# Patient Record
Sex: Female | Born: 1990 | Race: Black or African American | Marital: Single | Smoking: Current every day smoker
Health system: Southern US, Community
[De-identification: ages and names within clinical notes are randomized; demographics above are authoritative.]

## PROBLEM LIST (undated history)

## (undated) ENCOUNTER — Inpatient Hospital Stay (HOSPITAL_COMMUNITY): Payer: Self-pay

## (undated) HISTORY — PX: NO PAST SURGERIES: SHX2092

---
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
## 2014-02-04 ENCOUNTER — Encounter (HOSPITAL_COMMUNITY): Payer: Self-pay | Admitting: *Deleted

## 2015-04-06 NOTE — L&D Delivery Note (Signed)
Delivery Note At 6:10 PM a viable female was delivered via Vaginal, Spontaneous Delivery (Presentation:LOA ).  APGAR:9,9 ; weight pending .   Placenta status:intact ,delivered via Tomasa BlaseSchultz .  Cord: three vessels with the following complications:none .  Cord pH: N/A  Anesthesia:  Epidural Episiotomy: None Lacerations: 2nd degree Suture Repair:  3.0 vicryl Est. Blood Loss (mL):  50  Mom to postpartum.  Baby to Couplet care / Skin to Skin.  Clayton BiblesSamantha Weinhold, SNM 02/29/2016, 6:23 PM

## 2015-10-14 ENCOUNTER — Encounter: Payer: Self-pay | Admitting: Obstetrics and Gynecology

## 2015-10-14 ENCOUNTER — Ambulatory Visit (INDEPENDENT_AMBULATORY_CARE_PROVIDER_SITE_OTHER): Payer: Medicaid Other | Admitting: Obstetrics and Gynecology

## 2015-10-14 VITALS — BP 118/78 | HR 87 | Wt 198.0 lb

## 2015-10-14 DIAGNOSIS — Z3492 Encounter for supervision of normal pregnancy, unspecified, second trimester: Secondary | ICD-10-CM

## 2015-10-14 DIAGNOSIS — O09212 Supervision of pregnancy with history of pre-term labor, second trimester: Secondary | ICD-10-CM

## 2015-10-14 DIAGNOSIS — Z8751 Personal history of pre-term labor: Secondary | ICD-10-CM | POA: Insufficient documentation

## 2015-10-14 DIAGNOSIS — O0932 Supervision of pregnancy with insufficient antenatal care, second trimester: Secondary | ICD-10-CM | POA: Diagnosis not present

## 2015-10-14 DIAGNOSIS — O09219 Supervision of pregnancy with history of pre-term labor, unspecified trimester: Secondary | ICD-10-CM

## 2015-10-14 DIAGNOSIS — Z6830 Body mass index (BMI) 30.0-30.9, adult: Secondary | ICD-10-CM

## 2015-10-14 DIAGNOSIS — Z3482 Encounter for supervision of other normal pregnancy, second trimester: Secondary | ICD-10-CM | POA: Diagnosis not present

## 2015-10-14 DIAGNOSIS — O09892 Supervision of other high risk pregnancies, second trimester: Secondary | ICD-10-CM

## 2015-10-14 DIAGNOSIS — Z3490 Encounter for supervision of normal pregnancy, unspecified, unspecified trimester: Secondary | ICD-10-CM | POA: Insufficient documentation

## 2015-10-14 LAB — POCT URINALYSIS DIPSTICK
Bilirubin, UA: NEGATIVE
Blood, UA: 50
Glucose, UA: NEGATIVE
Nitrite, UA: NEGATIVE
SPEC GRAV UA: 1.015
UROBILINOGEN UA: NEGATIVE
pH, UA: 6

## 2015-10-14 NOTE — Progress Notes (Signed)
New OB Note  10/14/2015   Clinic: [facility]  Chief Complaint: New OB  Transfer of Care Patient: no  History of Present Illness: Ms. Wernette is a 25 y.o. G2P0101 @ 21/0 weeks ([place] 2/14, based on Patient's last menstrual period was 05/20/2015 (exact date).), with the above CC. Preg complicated by has Supervision of normal pregnancy in second trimester; History of preterm delivery, currently pregnant; and Late prenatal care affecting pregnancy in second trimester, antepartum on her problem list.  Her periods were: regular, qmonth She was using no method when she conceived.  She has Negative signs or symptoms of nausea/vomiting of pregnancy. She has Negative signs or symptoms of miscarriage or preterm labor She identifies Negative Zika risk factors for her and her partner  ROS: A 12-point review of systems was performed and negative, except as stated in the above HPI.  OBGYN History: As per HPI. OB History  Gravida Para Term Preterm AB SAB TAB Ectopic Multiple Living  _0    # Outcome Date GA Lbr Len/2nd Weight Sex Delivery Anes PTL Lv  2 Current           1 Preterm 07/08/11 23w3d36:12 / 00:44 5 lb 12.4 oz (2.62 kg) F Vag-Spont EPI  Y     Comments: WNL      Any prior children are healthy, doing well, without any problems or issues: yes History of abnormal pap smears: No. Last pap smear patient unsure History of STIs: No   Past Medical History: Past Medical History  Diagnosis Date  . Asthma     Past Surgical History: Past Surgical History  Procedure Laterality Date  . No past surgeries      Family History:  She denies any female cancers, bleeding or blood clotting disorders.  She denies any history of mental retardation, birth defects or genetic disorders in her or the FOB's history  Social History:  Social History   Social History  . Marital Status: Single    Spouse Name: N/A  . Number of Children: N/A  . Years of Education: N/A   Occupational  History  . Not on file.   Social History Main Topics  . Smoking status: Former SResearch scientist (life sciences) . Smokeless tobacco: Never Used  . Alcohol Use: No     Comment: Occas. when not preg.  . Drug Use: No  . Sexual Activity: Yes    Birth Control/ Protection: None     Comment: 1 week ago   Other Topics Concern  . Not on file   Social History Narrative    Allergy: No Known Allergies  Health Maintenance:  Mammogram Up to Date: not applicable  Current Outpatient Medications: PNV  Physical Exam:   BP 118/78 mmHg  Pulse 87  Wt 198 lb (89.812 kg)  LMP 05/20/2015 (Exact Date) Body mass index is 31.48 kg/(m^2). Fundal height: 20 FHTs: 140s on u/s  General appearance: Well nourished, well developed female in no acute distress.  Neck:  Supple, normal appearance, and no thyromegaly  Cardiovascular: S1, S2 normal, no murmur, rub or gallop, regular rate and rhythm Respiratory:  Clear to auscultation bilateral. Normal respiratory effort Abdomen: positive bowel sounds and no masses, hernias; diffusely non tender to palpation, non distended Breasts: breasts appear normal, no suspicious masses, no skin or nipple changes or axillary nodes, and normal inspection. Neuro/Psych:  Normal mood and affect.  Skin:  Warm and dry.  Lymphatic:  No inguinal lymphadenopathy.  Pelvic exam: is not limited by body habitus EGBUS: within normal limits, Vagina: within normal limits and with no blood in the vault, Cervix: normal appearing cervix without discharge or lesions, closed/long/high, Uterus:  enlarged, c/w 20 week size, and Adnexa:  normal adnexa and no mass, fullness, tenderness  Laboratory: none  Imaging:  Bedside shows SLIUP c/w 20/6 based on BPD, HC and FL. Subjectively normal AFI, normal FHR and good FM.  Assessment: pt doing well  Plan: 1. Supervision of normal pregnancy in second trimester Routine care. Counseled her to stop smoking as risks d/w her.  - Prenatal Profile I - HIV antibody  (with reflex) - Urine culture - Hemoglobinopathy evaluation - Cystic fibrosis gene test - AFP, Quad Screen - [place] MFM OB COMP + 14 WK; Future - Pap IG, CT/NG w/ reflex HPV when ASC-U - POCT urinalysis dipstick  2. History of preterm delivery, currently pregnant, second trimester 36/3 wk PPROM in 07/2011. Once u/s is officially dates and dates officially established, can d/w pt but likely too late for 17p  3. Late prenatal care affecting pregnancy in second trimester, antepartum UDS today - [id number] 11+Oxyco+Alc+Crt-Bund  4. BMI 30.0-30.9,adult Early 1hr today and baseline pre-x labs - Glucose Tolerance, 1 Hour - Comp Met (CMET) - Protein / Creatinine Ratio, Urine   Problem list reviewed and updated.  Follow up in 3 weeks.  >50% of 25 min visit spent on counseling and coordination of care.   Durene Romans MD Attending Center for [facility])

## 2015-10-15 ENCOUNTER — Encounter: Payer: Self-pay | Admitting: Obstetrics and Gynecology

## 2015-10-15 DIAGNOSIS — O99019 Anemia complicating pregnancy, unspecified trimester: Secondary | ICD-10-CM | POA: Insufficient documentation

## 2015-10-16 ENCOUNTER — Encounter: Payer: Self-pay | Admitting: Obstetrics and Gynecology

## 2015-10-16 DIAGNOSIS — Z6831 Body mass index (BMI) 31.0-31.9, adult: Secondary | ICD-10-CM | POA: Insufficient documentation

## 2015-10-16 LAB — PAP IG, CT-NG, RFX HPV ASCU
Chlamydia, Nuc. Acid Amp: NEGATIVE
Gonococcus by Nucleic Acid Amp: NEGATIVE
PAP SMEAR COMMENT: 0

## 2015-10-16 LAB — URINE CULTURE

## 2015-10-21 ENCOUNTER — Ambulatory Visit (HOSPITAL_COMMUNITY)
Admission: RE | Admit: 2015-10-21 | Discharge: 2015-10-21 | Disposition: A | Discharge status: Home / Self Care | Payer: Medicaid Other | Source: Ambulatory Visit | Attending: Obstetrics and Gynecology | Admitting: Obstetrics and Gynecology

## 2015-10-21 ENCOUNTER — Other Ambulatory Visit: Payer: Self-pay | Admitting: Obstetrics and Gynecology

## 2015-10-21 DIAGNOSIS — O09892 Supervision of other high risk pregnancies, second trimester: Secondary | ICD-10-CM

## 2015-10-21 DIAGNOSIS — O0932 Supervision of pregnancy with insufficient antenatal care, second trimester: Secondary | ICD-10-CM | POA: Diagnosis present

## 2015-10-21 DIAGNOSIS — O99512 Diseases of the respiratory system complicating pregnancy, second trimester: Secondary | ICD-10-CM | POA: Diagnosis not present

## 2015-10-21 DIAGNOSIS — J45909 Unspecified asthma, uncomplicated: Secondary | ICD-10-CM | POA: Diagnosis not present

## 2015-10-21 DIAGNOSIS — Z3492 Encounter for supervision of normal pregnancy, unspecified, second trimester: Secondary | ICD-10-CM

## 2015-10-21 DIAGNOSIS — Z3A22 22 weeks gestation of pregnancy: Secondary | ICD-10-CM | POA: Insufficient documentation

## 2015-10-21 DIAGNOSIS — O09212 Supervision of pregnancy with history of pre-term labor, second trimester: Secondary | ICD-10-CM | POA: Diagnosis not present

## 2015-10-21 LAB — PRENATAL PROFILE I(LABCORP)
ANTIBODY SCREEN: NEGATIVE
BASOS: 0 %
Basophils Absolute: 0 10*3/uL (ref 0.0–0.2)
EOS (ABSOLUTE): 0 10*3/uL (ref 0.0–0.4)
Eos: 0 %
HEP B S AG: NEGATIVE
Hematocrit: 31.6 % — ABNORMAL LOW (ref 34.0–46.6)
Hemoglobin: 10 g/dL — ABNORMAL LOW (ref 11.1–15.9)
IMMATURE GRANS (ABS): 0 10*3/uL (ref 0.0–0.1)
IMMATURE GRANULOCYTES: 0 %
LYMPHS ABS: 1.5 10*3/uL (ref 0.7–3.1)
Lymphs: 13 %
MCH: 26.8 pg (ref 26.6–33.0)
MCHC: 31.6 g/dL (ref 31.5–35.7)
MCV: 85 fL (ref 79–97)
MONOS ABS: 0.4 10*3/uL (ref 0.1–0.9)
Monocytes: 4 %
NEUTROS PCT: 83 %
Neutrophils Absolute: 9.5 10*3/uL — ABNORMAL HIGH (ref 1.4–7.0)
Platelets: 244 10*3/uL (ref 150–379)
RBC: 3.73 x10E6/uL — ABNORMAL LOW (ref 3.77–5.28)
RDW: 17.4 % — ABNORMAL HIGH (ref 12.3–15.4)
RPR: NONREACTIVE
RUBELLA: 4.13 {index} (ref >0.99)
Rh Factor: POSITIVE
WBC: 11.4 10*3/uL — ABNORMAL HIGH (ref 3.4–10.8)

## 2015-10-21 LAB — GLUCOSE TOLERANCE, 1 HOUR: Glucose, 1Hr PP: 107 mg/dL (ref 65–199)

## 2015-10-21 LAB — AFP, QUAD SCREEN
DIA Mom Value: 2.68
DIA Value (EIA): 486.92 pg/mL
DSR (By Age)    1 IN: 1078
DSR (SECOND TRIMESTER) 1 IN: 2524
Gestational Age: 21 WEEKS
MSAFP Mom: 0.92
MSAFP: 54.7 ng/mL
MSHCG Mom: 0.59
MSHCG: 10823 m[IU]/mL
Maternal Age At EDD: 23.8 YEARS
Osb Risk: 10000
PDF: 0
TEST RESULTS AFP: NEGATIVE
WEIGHT: 198 [lb_av]
uE3 Mom: 0.69
uE3 Value: 1.37 ng/mL

## 2015-10-21 LAB — COMPREHENSIVE METABOLIC PANEL
A/G RATIO: 1.4 (ref 1.2–2.2)
ALBUMIN: 3.7 g/dL (ref 3.5–5.5)
ALT: 7 IU/L (ref 0–32)
AST: 12 IU/L (ref 0–40)
Alkaline Phosphatase: 48 IU/L (ref 39–117)
BUN / CREAT RATIO: 13 (ref 9–23)
BUN: 7 mg/dL (ref 6–20)
Bilirubin Total: <0.2 mg/dL (ref 0.0–1.2)
CALCIUM: 8.9 mg/dL (ref 8.7–10.2)
CO2: 18 mmol/L (ref 18–29)
Chloride: 99 mmol/L (ref 96–106)
Creatinine, Ser: 0.53 mg/dL — ABNORMAL LOW (ref 0.57–1.00)
GFR calc Af Amer: 155 mL/min/{1.73_m2} (ref >59)
GFR, EST NON AFRICAN AMERICAN: 134 mL/min/{1.73_m2} (ref >59)
GLOBULIN, TOTAL: 2.6 g/dL (ref 1.5–4.5)
Glucose: 111 mg/dL — ABNORMAL HIGH (ref 65–99)
POTASSIUM: 3.6 mmol/L (ref 3.5–5.2)
SODIUM: 136 mmol/L (ref 134–144)
Total Protein: 6.3 g/dL (ref 6.0–8.5)

## 2015-10-21 LAB — HIV ANTIBODY (ROUTINE TESTING W REFLEX): HIV SCREEN 4TH GENERATION: NONREACTIVE

## 2015-10-21 LAB — HEMOGLOBINOPATHY EVALUATION
HGB A: 97.7 % (ref 94.0–98.0)
HGB C: 0 %
HGB S: 0 %
Hemoglobin A2 Quantitation: 2.3 % (ref 0.7–3.1)
Hemoglobin F Quantitation: 0 % (ref 0.0–2.0)

## 2015-10-21 LAB — PROTEIN / CREATININE RATIO, URINE
Creatinine, Urine: 228 mg/dL
PROTEIN UR: 16.8 mg/dL
Protein/Creat Ratio: 74 mg/g creat (ref 0–200)

## 2015-10-21 LAB — CYSTIC FIBROSIS GENE TEST

## 2015-11-04 ENCOUNTER — Encounter: Payer: Medicaid Other | Admitting: Family Medicine

## 2015-11-10 ENCOUNTER — Ambulatory Visit (INDEPENDENT_AMBULATORY_CARE_PROVIDER_SITE_OTHER): Payer: Medicaid Other | Admitting: Obstetrics and Gynecology

## 2015-11-10 VITALS — BP 98/69 | HR 88 | Wt 203.0 lb

## 2015-11-10 DIAGNOSIS — Z3482 Encounter for supervision of other normal pregnancy, second trimester: Secondary | ICD-10-CM

## 2015-11-10 DIAGNOSIS — Z6831 Body mass index (BMI) 31.0-31.9, adult: Secondary | ICD-10-CM

## 2015-11-10 DIAGNOSIS — O09212 Supervision of pregnancy with history of pre-term labor, second trimester: Secondary | ICD-10-CM

## 2015-11-10 DIAGNOSIS — O09892 Supervision of other high risk pregnancies, second trimester: Secondary | ICD-10-CM

## 2015-11-10 DIAGNOSIS — O0932 Supervision of pregnancy with insufficient antenatal care, second trimester: Secondary | ICD-10-CM

## 2015-11-10 DIAGNOSIS — Z3492 Encounter for supervision of normal pregnancy, unspecified, second trimester: Secondary | ICD-10-CM

## 2015-11-10 MED ORDER — DOCUSATE SODIUM 100 MG PO CAPS: 100.0000 mg | ORAL_CAPSULE | Freq: Two times a day (BID) | ORAL | 2 refills | Status: DC | PRN

## 2015-11-10 MED ORDER — PRENATAL MULTIVITAMIN CH: 1.0000 | ORAL_TABLET | Freq: Every day | ORAL | 6 refills | Status: DC

## 2015-11-10 MED ORDER — FERROUS SULFATE 325 (65 FE) MG PO TABS
325.0000 mg | ORAL_TABLET | Freq: Two times a day (BID) | ORAL | 1 refills | Status: DC
Start: 2015-11-10 — End: 2017-07-30

## 2015-11-10 NOTE — Progress Notes (Signed)
Subjective:  Hannah Stone is a 25 y.o. G2P0101 at 2313w6d being seen today for ongoing prenatal care.  She is currently monitored for the following issues for this high-risk pregnancy and has Supervision of normal pregnancy in second trimester; History of preterm delivery, currently pregnant; Late prenatal care affecting pregnancy in second trimester, antepartum; Anemia in pregnancy; and BMI 31.0-31.9,adult on her problem list.  Patient reports no complaints.  Contractions: Irregular. Vag. Bleeding: None.  Movement: Present. Denies leaking of fluid.   The following portions of the patient's history were reviewed and updated as appropriate: allergies, current medications, past family history, past medical history, past social history, past surgical history and problem list. Problem list updated.  Objective:   Vitals:   11/10/15 0949  BP: 98/69  Pulse: 88  Weight: 203 lb (92.1 kg)    Fetal Status: Fetal Heart Rate (bpm): 140 Fundal Height: 25 cm Movement: Present     General:  Alert, oriented and cooperative. Patient is in no acute distress.  Skin: Skin is warm and dry. No rash noted.   Cardiovascular: Normal heart rate noted  Respiratory: Normal respiratory effort, no problems with respiration noted  Abdomen: Soft, gravid, appropriate for gestational age. Pain/Pressure: Present     Pelvic:  Cervical exam deferred        Extremities: Normal range of motion.  Edema: None  Mental Status: Normal mood and affect. Normal behavior. Normal judgment and thought content.   Urinalysis:      Assessment and Plan:  Pregnancy: G2P0101 at 4013w6d  1. Supervision of normal pregnancy in second trimester Patient is doing well without complaints Follow up ultrasound ordered to complete anatomy ordered Rx ferrous sulfate and colace provided RTC in 3 weeks of third trimester labs - US MFM OB FOLLOW UP; Future  2. Late prenatal care affecting pregnancy in second trimester, antepartum   3.  History of preterm delivery, currently pregnant, second trimester PPROM at 36+ weeks- declined 17-P  4. BMI 31.0-31.9,adult   Preterm labor symptoms and general obstetric precautions including but not limited to vaginal bleeding, contractions, leaking of fluid and fetal movement were reviewed in detail with the patient. Please refer to After Visit Summary for other counseling recommendations.  Return in about 3 weeks (around 12/01/2015) for ROB/ glucola.   Catalina AntiguaPeggy Kenly Henckel, MD

## 2015-11-20 ENCOUNTER — Ambulatory Visit (HOSPITAL_COMMUNITY): Payer: Medicaid Other | Attending: Obstetrics and Gynecology

## 2015-12-01 ENCOUNTER — Ambulatory Visit (INDEPENDENT_AMBULATORY_CARE_PROVIDER_SITE_OTHER): Payer: Medicaid Other | Admitting: Obstetrics & Gynecology

## 2015-12-01 ENCOUNTER — Other Ambulatory Visit: Payer: Medicaid Other

## 2015-12-01 ENCOUNTER — Encounter: Payer: Self-pay | Admitting: Obstetrics & Gynecology

## 2015-12-01 VITALS — BP 120/73 | HR 84 | Temp 97.3°F | Wt 204.9 lb

## 2015-12-01 DIAGNOSIS — O09212 Supervision of pregnancy with history of pre-term labor, second trimester: Secondary | ICD-10-CM

## 2015-12-01 DIAGNOSIS — Z3492 Encounter for supervision of normal pregnancy, unspecified, second trimester: Secondary | ICD-10-CM

## 2015-12-01 DIAGNOSIS — O09213 Supervision of pregnancy with history of pre-term labor, third trimester: Secondary | ICD-10-CM

## 2015-12-01 DIAGNOSIS — O09893 Supervision of other high risk pregnancies, third trimester: Secondary | ICD-10-CM

## 2015-12-01 LAB — POCT URINALYSIS DIPSTICK
Bilirubin, UA: NEGATIVE
GLUCOSE UA: NEGATIVE
Ketones, UA: NEGATIVE
NITRITE UA: NEGATIVE
Protein, UA: NEGATIVE
RBC UA: NEGATIVE
Spec Grav, UA: 1.01
UROBILINOGEN UA: 0.2
pH, UA: 8

## 2015-12-01 NOTE — Progress Notes (Signed)
Ribs sore RUQ Subjective:  Nena AlexanderSherri Norvell J Carsten is a 25 y.o. G2P0101 at 7551w6d being seen today for ongoing prenatal care.  She is currently monitored for the following issues for this high-risk pregnancy and has Supervision of normal pregnancy in second trimester; History of preterm delivery, currently pregnant; Late prenatal care affecting pregnancy in second trimester, antepartum; Anemia in pregnancy; and BMI 31.0-31.9,adult on her problem list.  Patient reports RUQ sore.  Contractions: Irregular. Vag. Bleeding: None.  Movement: Present. Denies leaking of fluid.   The following portions of the patient's history were reviewed and updated as appropriate: allergies, current medications, past family history, past medical history, past social history, past surgical history and problem list. Problem list updated.  Objective:   Vitals:   12/01/15 1009  BP: 120/73  Pulse: 84  Temp: 97.3 F (36.3 C)  Weight: 204 lb 14.4 oz (92.9 kg)    Fetal Status:     Movement: Present     General:  Alert, oriented and cooperative. Patient is in no acute distress.  Skin: Skin is warm and dry. No rash noted.   Cardiovascular: Normal heart rate noted  Respiratory: Normal respiratory effort, no problems with respiration noted  Abdomen: Soft, gravid, appropriate for gestational age. Pain/Pressure: Present     Pelvic:  Cervical exam deferred        Extremities: Normal range of motion.  Edema: None  Mental Status: Normal mood and affect. Normal behavior. Normal judgment and thought content.   Urinalysis: Urine Protein: Negative Urine Glucose: Negative  Assessment and Plan:  Pregnancy: G2P0101 at 3851w6d  1. Prenatal care, second trimester 2 hr GTT  - POCT Urinalysis Dipstick  2. Supervision of normal pregnancy in second trimester No sx PTL   3. History of preterm delivery, currently pregnant, third trimester Declined 17 P  Preterm labor symptoms and general obstetric precautions including but not  limited to vaginal bleeding, contractions, leaking of fluid and fetal movement were reviewed in detail with the patient. Please refer to After Visit Summary for other counseling recommendations.  2 week   Adam PhenixJames G Destin Kittler, MD

## 2015-12-02 LAB — CBC
HEMATOCRIT: 32.2 % — AB (ref 34.0–46.6)
HEMOGLOBIN: 10.5 g/dL — AB (ref 11.1–15.9)
MCH: 28.4 pg (ref 26.6–33.0)
MCHC: 32.6 g/dL (ref 31.5–35.7)
MCV: 87 fL (ref 79–97)
Platelets: 238 10*3/uL (ref 150–379)
RBC: 3.7 x10E6/uL — AB (ref 3.77–5.28)
RDW: 16.1 % — ABNORMAL HIGH (ref 12.3–15.4)
WBC: 14.4 10*3/uL — ABNORMAL HIGH (ref 3.4–10.8)

## 2015-12-02 LAB — GLUCOSE TOLERANCE, 2 HOURS W/ 1HR
GLUCOSE, 2 HOUR: 119 mg/dL (ref 65–152)
Glucose, 1 hour: 135 mg/dL (ref 65–179)
Glucose, Fasting: 94 mg/dL — ABNORMAL HIGH (ref 65–91)

## 2015-12-02 LAB — RPR: RPR Ser Ql: NONREACTIVE

## 2015-12-02 LAB — HIV ANTIBODY (ROUTINE TESTING W REFLEX): HIV Screen 4th Generation wRfx: NONREACTIVE

## 2015-12-03 ENCOUNTER — Ambulatory Visit (HOSPITAL_COMMUNITY)
Admission: RE | Admit: 2015-12-03 | Discharge: 2015-12-03 | Disposition: A | Discharge status: Home / Self Care | Payer: Medicaid Other | Source: Ambulatory Visit | Attending: Obstetrics and Gynecology | Admitting: Obstetrics and Gynecology

## 2015-12-03 DIAGNOSIS — Z3492 Encounter for supervision of normal pregnancy, unspecified, second trimester: Secondary | ICD-10-CM

## 2015-12-03 DIAGNOSIS — Z36 Encounter for antenatal screening of mother: Secondary | ICD-10-CM | POA: Diagnosis not present

## 2015-12-03 DIAGNOSIS — Z3A28 28 weeks gestation of pregnancy: Secondary | ICD-10-CM | POA: Insufficient documentation

## 2015-12-03 DIAGNOSIS — O0933 Supervision of pregnancy with insufficient antenatal care, third trimester: Secondary | ICD-10-CM | POA: Insufficient documentation

## 2015-12-03 DIAGNOSIS — O09213 Supervision of pregnancy with history of pre-term labor, third trimester: Secondary | ICD-10-CM | POA: Insufficient documentation

## 2015-12-15 ENCOUNTER — Ambulatory Visit (INDEPENDENT_AMBULATORY_CARE_PROVIDER_SITE_OTHER): Payer: Medicaid Other | Admitting: Obstetrics & Gynecology

## 2015-12-15 VITALS — BP 105/65 | HR 87 | Temp 98.9°F | Wt 210.5 lb

## 2015-12-15 DIAGNOSIS — O09213 Supervision of pregnancy with history of pre-term labor, third trimester: Secondary | ICD-10-CM

## 2015-12-15 DIAGNOSIS — O09893 Supervision of other high risk pregnancies, third trimester: Secondary | ICD-10-CM

## 2015-12-15 DIAGNOSIS — Z3483 Encounter for supervision of other normal pregnancy, third trimester: Secondary | ICD-10-CM

## 2015-12-15 NOTE — Progress Notes (Signed)
   PRENATAL VISIT NOTE  Subjective:  Hannah Stone is a 25 y.o. G2P0101 at 6020w6d being seen today for ongoing prenatal care.  She is currently monitored for the following issues for this high-risk pregnancy and has Supervision of normal pregnancy, antepartum; History of preterm delivery, currently pregnant; Late prenatal care affecting pregnancy in second trimester, antepartum; Anemia in pregnancy; and BMI 31.0-31.9,adult on her problem list.  Patient reports no complaints.  Contractions: Not present. Vag. Bleeding: None.  Movement: Present. Denies leaking of fluid.   The following portions of the patient's history were reviewed and updated as appropriate: allergies, current medications, past family history, past medical history, past social history, past surgical history and problem list. Problem list updated.  Objective:   Vitals:   12/15/15 1532  BP: 105/65  Pulse: 87  Temp: 98.9 F (37.2 C)  Weight: 210 lb 8 oz (95.5 kg)    Fetal Status:     Movement: Present     General:  Alert, oriented and cooperative. Patient is in no acute distress.  Skin: Skin is warm and dry. No rash noted.   Cardiovascular: Normal heart rate noted  Respiratory: Normal respiratory effort, no problems with respiration noted  Abdomen: Soft, gravid, appropriate for gestational age. Pain/Pressure: Present     Pelvic:  Cervical exam deferred        Extremities: Normal range of motion.  Edema: None  Mental Status: Normal mood and affect. Normal behavior. Normal judgment and thought content.   Urinalysis: Urine Protein: Trace Urine Glucose: Negative  Assessment and Plan:  Pregnancy: G2P0101 at 6020w6d  1. History of preterm delivery, currently pregnant, third trimester Passed 2 hr GTT  2. Supervision of normal pregnancy, antepartum, third trimester Normal growth on US f/u  Preterm labor symptoms and general obstetric precautions including but not limited to vaginal bleeding, contractions,  leaking of fluid and fetal movement were reviewed in detail with the patient. Please refer to After Visit Summary for other counseling recommendations.  Return in about 2 weeks (around 12/29/2015).  Adam PhenixJames G Arnold, MD

## 2015-12-15 NOTE — Patient Instructions (Signed)

## 2015-12-24 ENCOUNTER — Telehealth: Payer: Self-pay | Admitting: *Deleted

## 2015-12-24 NOTE — Telephone Encounter (Signed)
Patient c/o cough and cold symptoms.Explained to patient she may have Plain Tylenol evey 6 hours for pain,plain Robitussin for cough and Clairitin 1 tablet daily for congestion.[facility] [phone].

## 2015-12-31 ENCOUNTER — Ambulatory Visit (INDEPENDENT_AMBULATORY_CARE_PROVIDER_SITE_OTHER): Payer: Medicaid Other | Admitting: Obstetrics and Gynecology

## 2015-12-31 VITALS — BP 124/73 | HR 86 | Temp 98.9°F | Wt 205.0 lb

## 2015-12-31 DIAGNOSIS — O09213 Supervision of pregnancy with history of pre-term labor, third trimester: Secondary | ICD-10-CM | POA: Diagnosis not present

## 2015-12-31 DIAGNOSIS — Z3483 Encounter for supervision of other normal pregnancy, third trimester: Secondary | ICD-10-CM

## 2015-12-31 DIAGNOSIS — O09893 Supervision of other high risk pregnancies, third trimester: Secondary | ICD-10-CM

## 2015-12-31 DIAGNOSIS — O0932 Supervision of pregnancy with insufficient antenatal care, second trimester: Secondary | ICD-10-CM | POA: Diagnosis not present

## 2015-12-31 DIAGNOSIS — Z23 Encounter for immunization: Secondary | ICD-10-CM | POA: Diagnosis not present

## 2015-12-31 MED ORDER — TETANUS-DIPHTH-ACELL PERTUSSIS 5-2.5-18.5 LF-MCG/0.5 IM SUSP
0.5000 mL | Freq: Once | INTRAMUSCULAR | Status: AC
  Administered 2015-12-31: 0.5 mL via INTRAMUSCULAR

## 2015-12-31 NOTE — Addendum Note (Signed)
Addended by: Natale MilchSTALLING, Deniz Hannan D on: 12/31/2015 05:06 PM   Modules accepted: Orders

## 2015-12-31 NOTE — Progress Notes (Signed)
   PRENATAL VISIT NOTE  Subjective:  Hannah Stone is a 25 y.o. G2P0101 at 7176w1d being seen today for ongoing prenatal care.  She is currently monitored for the following issues for this high-risk pregnancy and has Supervision of normal pregnancy, antepartum; History of preterm delivery, currently pregnant; Late prenatal care affecting pregnancy in second trimester, antepartum; Anemia in pregnancy; and BMI 31.0-31.9,adult on her problem list.  Patient reports intermittent pressure.  Contractions: Not present. Vag. Bleeding: None.  Movement: Present. Denies leaking of fluid.   The following portions of the patient's history were reviewed and updated as appropriate: allergies, current medications, past family history, past medical history, past social history, past surgical history and problem list. Problem list updated.  Objective:   Vitals:   12/31/15 1531  BP: 124/73  Pulse: 86  Temp: 98.9 F (37.2 C)  Weight: 205 lb (93 kg)    Fetal Status: Fetal Heart Rate (bpm): 136 Fundal Height: 32 cm Movement: Present     General:  Alert, oriented and cooperative. Patient is in no acute distress.  Skin: Skin is warm and dry. No rash noted.   Cardiovascular: Normal heart rate noted  Respiratory: Normal respiratory effort, no problems with respiration noted  Abdomen: Soft, gravid, appropriate for gestational age. Pain/Pressure: Present     Pelvic:  Cervical exam deferred        Extremities: Normal range of motion.  Edema: None  Mental Status: Normal mood and affect. Normal behavior. Normal judgment and thought content.   Urinalysis:      Assessment and Plan:  Pregnancy: G2P0101 at 8476w1d  1. Supervision of normal pregnancy, antepartum, third trimester Patient is doing well without complaints FLu and TDap vaccine today  2. Late prenatal care affecting pregnancy in second trimester, antepartum Onset of care at 21 weeks  3. History of preterm delivery, currently pregnant, third  trimester Decline 17-P  Preterm labor symptoms and general obstetric precautions including but not limited to vaginal bleeding, contractions, leaking of fluid and fetal movement were reviewed in detail with the patient. Please refer to After Visit Summary for other counseling recommendations.  Return in about 2 weeks (around 01/14/2016).  Catalina AntiguaPeggy Maurene Hollin, MD

## 2015-12-31 NOTE — Progress Notes (Signed)
Patient states that she has pressure that comes and goes but is not feeling any contractions.

## 2016-01-03 ENCOUNTER — Inpatient Hospital Stay (HOSPITAL_COMMUNITY)
Admission: AD | Admit: 2016-01-03 | Discharge: 2016-01-03 | Disposition: A | Discharge status: Home / Self Care | Payer: Medicaid Other | Source: Ambulatory Visit | Attending: Obstetrics & Gynecology | Admitting: Obstetrics & Gynecology

## 2016-01-03 ENCOUNTER — Encounter (HOSPITAL_COMMUNITY): Payer: Self-pay

## 2016-01-03 DIAGNOSIS — O23593 Infection of other part of genital tract in pregnancy, third trimester: Secondary | ICD-10-CM | POA: Diagnosis not present

## 2016-01-03 DIAGNOSIS — B9689 Other specified bacterial agents as the cause of diseases classified elsewhere: Secondary | ICD-10-CM

## 2016-01-03 DIAGNOSIS — Z87891 Personal history of nicotine dependence: Secondary | ICD-10-CM | POA: Insufficient documentation

## 2016-01-03 DIAGNOSIS — Z3A32 32 weeks gestation of pregnancy: Secondary | ICD-10-CM | POA: Diagnosis not present

## 2016-01-03 DIAGNOSIS — R1011 Right upper quadrant pain: Secondary | ICD-10-CM | POA: Diagnosis present

## 2016-01-03 DIAGNOSIS — O2343 Unspecified infection of urinary tract in pregnancy, third trimester: Secondary | ICD-10-CM | POA: Diagnosis not present

## 2016-01-03 DIAGNOSIS — N760 Acute vaginitis: Secondary | ICD-10-CM

## 2016-01-03 LAB — URINE MICROSCOPIC-ADD ON

## 2016-01-03 LAB — URINALYSIS, ROUTINE W REFLEX MICROSCOPIC
Bilirubin Urine: NEGATIVE
GLUCOSE, UA: NEGATIVE mg/dL
KETONES UR: NEGATIVE mg/dL
NITRITE: NEGATIVE
PROTEIN: NEGATIVE mg/dL
Specific Gravity, Urine: 1.02 (ref 1.005–1.030)
pH: 6.5 (ref 5.0–8.0)

## 2016-01-03 LAB — WET PREP, GENITAL
Sperm: NONE SEEN
TRICH WET PREP: NONE SEEN
YEAST WET PREP: NONE SEEN

## 2016-01-03 MED ORDER — NITROFURANTOIN MONOHYD MACRO 100 MG PO CAPS: 100.0000 mg | ORAL_CAPSULE | Freq: Two times a day (BID) | ORAL | 0 refills | Status: DC

## 2016-01-03 MED ORDER — METRONIDAZOLE 500 MG PO TABS: 500.0000 mg | ORAL_TABLET | Freq: Two times a day (BID) | ORAL | 0 refills | Status: DC

## 2016-01-03 NOTE — MAU Provider Note (Signed)
History  Chief Complaint:  Labor Eval  Hannah Stone is a 25 y.o. 722P0101 female at 5475w4d presenting w/ report of RUQ pain that began today-'feels like it might have been the baby's hand or something', then started cramping. Denies n/v, pain after eating fatty/greasy foods.    Reports active fetal movement, contractions: not sure- she can't remember what uc's feel like, vaginal bleeding: none, membranes: intact. Denies abnormal/malodorous vag d/c or vulvovaginal itching/irritation. Does report increased urinary frequency, no dysuria/urgency/hematuria.  Had sex w/in last 24hrs.  [facility]  Next visit 10/16. Pregnancy complicated by h/o ptb @ 36wks, anemia  Obstetrical History: OB History    Gravida Para Term Preterm AB Living   2 1 0 1 0 1   SAB TAB Ectopic Multiple Live Births   0 0 0 0 1      Past Medical History: Past Medical History:  Diagnosis Date  . Asthma     Past Surgical History: Past Surgical History:  Procedure Laterality Date  . NO PAST SURGERIES      Social History: Social History   Social History  . Marital status: Single    Spouse name: N/A  . Number of children: N/A  . Years of education: N/A   Social History Main Topics  . Smoking status: Former Games developermoker  . Smokeless tobacco: Never Used  . Alcohol use No     Comment: Occas. when not preg.  . Drug use: No  . Sexual activity: Yes    Birth control/ protection: None     Comment: 1 week ago   Other Topics Concern  . None   Social History Narrative  . None    Allergies: No Known Allergies  Prescriptions Prior to Admission  Medication Sig Dispense Refill Last Dose  . loratadine (CLARITIN) 10 MG tablet Take 10 mg by mouth daily.   Past Week at Unknown time  . Prenatal Vit-Fe Fumarate-FA (PRENATAL MULTIVITAMIN) TABS tablet Take 1 tablet by mouth daily at 12 noon. 30 tablet 6 01/02/2016 at Unknown time  . docusate sodium (COLACE) 100 MG capsule Take 1 capsule (100 mg total)  by mouth 2 (two) times daily as needed. (Patient not taking: Reported on 01/03/2016) 30 capsule 2 Not Taking at Unknown time  . ferrous sulfate (FERROUSUL) 325 (65 FE) MG tablet Take 1 tablet (325 mg total) by mouth 2 (two) times daily. (Patient not taking: Reported on 01/03/2016) 60 tablet 1 Not Taking at Unknown time    Review of Systems  Pertinent pos/neg as indicated in HPI  Physical Exam  Blood pressure 138/79, pulse 92, temperature 97.8 F (36.6 C), temperature source Oral, resp. rate 18, last menstrual period 05/20/2015, unknown if currently breastfeeding. General appearance: alert, cooperative and no distress Lungs: clear to auscultation bilaterally, normal effort Heart: regular rate and rhythm Abdomen: gravid, soft, non-tender, RUQ non-tender Extremities: trace edema DTR's 2+  Spec exam: cx visually closed, small amt thin white malodorous d/c Cultures/Specimens: wet prep, gc/ct  SVE: outer os 1/inner os closed/thick/-3 Presentation: unsure  Fetal monitoring: FHR: 135 bpm, variability: moderate,  Accelerations: Present,  decelerations:  Absent Uterine activity: ui  MAU Course  Reactive NST SVE Cultures UA  Labs:  Results for orders placed or performed during the hospital encounter of 01/03/16 (from the past 24 hour(s))  Urinalysis, Routine w reflex microscopic (not at [facility])     Status: Abnormal   Collection Time: 01/03/16  1:44 PM  Result Value Ref Range  Color, Urine YELLOW YELLOW   APPearance CLEAR CLEAR   Specific Gravity, Urine 1.020 1.005 - 1.030   pH 6.5 5.0 - 8.0   Glucose, UA NEGATIVE NEGATIVE mg/dL   Hgb urine dipstick TRACE (A) NEGATIVE   Bilirubin Urine NEGATIVE NEGATIVE   Ketones, ur NEGATIVE NEGATIVE mg/dL   Protein, ur NEGATIVE NEGATIVE mg/dL   Nitrite NEGATIVE NEGATIVE   Leukocytes, UA TRACE (A) NEGATIVE  Urine microscopic-add on     Status: Abnormal   Collection Time: 01/03/16  1:44 PM  Result Value Ref Range   Squamous Epithelial / LPF 0-5  (A) NONE SEEN   WBC, UA 6-30 0 - 5 WBC/hpf   RBC / HPF 0-5 0 - 5 RBC/hpf   Bacteria, UA FEW (A) NONE SEEN  Wet prep, genital     Status: Abnormal   Collection Time: 01/03/16  2:22 PM  Result Value Ref Range   Yeast Wet Prep HPF POC NONE SEEN NONE SEEN   Trich, Wet Prep NONE SEEN NONE SEEN   Clue Cells Wet Prep HPF POC PRESENT (A) NONE SEEN   WBC, Wet Prep HPF POC FEW (A) NONE SEEN   Sperm NONE SEEN     Imaging:  n/a Assessment and Plan  A:  [redacted]w[redacted]d SIUP  G2P0101  UTI, tx based on sx  BV  Cat 1 FHR P:  Rx macrobid bid x 7d for UTI, send urine for culture  Rx metronidazole 500mg  BID x 7d for BV, no sex while taking   Eat yogurt to help prevent yeast infection  Reviewed ptl s/s, fkc  Keep next appt at [facility] on 10/16 as scheduled   Marge Duncans CNM,WHNP-BC 9/30/20173:13 PM

## 2016-01-03 NOTE — MAU Note (Signed)
Been having pains on right side for a few days.

## 2016-01-03 NOTE — MAU Note (Signed)
Urine in lab 

## 2016-01-03 NOTE — Discharge Instructions (Signed)
Call the office or go to [facility] if:  You begin to have strong, frequent contractions  Your water breaks.  Sometimes it is a big gush of fluid, sometimes it is just a trickle that keeps getting your panties wet or running down your legs  You have vaginal bleeding.  It is normal to have a small amount of spotting if your cervix was checked.   You don't feel your baby moving like normal.  If you don't, get you something to eat and drink and lay down and focus on feeling your baby move.  You should feel at least 10 movements in 2 hours.  If you don't, you should call the office or go to [facility].   NO SEX WHILE TAKING MEDICINE  EAT YOGURT DAILY TO HELP PREVENT YEAST INFECTION  Pregnancy and Urinary Tract Infection A urinary tract infection (UTI) is a bacterial infection of the urinary tract. Infection of the urinary tract can include the ureters, kidneys (pyelonephritis), bladder (cystitis), and urethra (urethritis). All pregnant women should be screened for bacteria in the urinary tract. Identifying and treating a UTI will decrease the risk of preterm labor and developing more serious infections in both the mother and baby. CAUSES Bacteria germs cause almost all UTIs.  RISK FACTORS Many factors can increase your chances of getting a UTI during pregnancy. These include:  Having a short urethra.  Poor toilet and hygiene habits.  Sexual intercourse.  Blockage of urine along the urinary tract.  Problems with the pelvic muscles or nerves.  Diabetes.  Obesity.  Bladder problems after having several children.  Previous history of UTI. SIGNS AND SYMPTOMS   Pain, burning, or a stinging feeling when urinating.  Suddenly feeling the need to urinate right away (urgency).  Loss of bladder control (urinary incontinence).  Frequent urination, more than is common with pregnancy.  Lower abdominal or back discomfort.  Cloudy urine.  Blood in the urine  (hematuria).  Fever. When the kidneys are infected, the symptoms may be:  Back pain.  Flank pain on the right side more so than the left.  Fever.  Chills.  Nausea.  Vomiting. DIAGNOSIS  A urinary tract infection is usually diagnosed through urine tests. Additional tests and procedures are sometimes done. These may include:  Ultrasound exam of the kidneys, ureters, bladder, and urethra.  Looking in the bladder with a lighted tube (cystoscopy). TREATMENT Typically, UTIs can be treated with antibiotic medicines.  HOME CARE INSTRUCTIONS   Only take over-the-counter or prescription medicines as directed by your health care provider. If you were prescribed antibiotics, take them as directed. Finish them even if you start to feel better.  Drink enough fluids to keep your urine clear or pale yellow.  Do not have sexual intercourse until the infection is gone and your health care provider says it is okay.  Make sure you are tested for UTIs throughout your pregnancy. These infections often come back. Preventing a UTI in the Future  Practice good toilet habits. Always wipe from front to back. Use the tissue only once.  Do not hold your urine. Empty your bladder as soon as possible when the urge comes.  Do not douche or use deodorant sprays.  Wash with soap and warm water around the genital area and the anus.  Empty your bladder before and after sexual intercourse.  Wear underwear with a cotton crotch.  Avoid caffeine and carbonated drinks. They can irritate the bladder.  Drink cranberry juice or take cranberry  pills. This may decrease the risk of getting a UTI.  Do not drink alcohol.  Keep all your appointments and tests as scheduled. SEEK MEDICAL CARE IF:   Your symptoms get worse.  You are still having fevers 2 or more days after treatment begins.  You have a rash.  You feel that you are having problems with medicines prescribed.  You have abnormal vaginal  discharge. SEEK IMMEDIATE MEDICAL CARE IF:   You have back or flank pain.  You have chills.  You have blood in your urine.  You have nausea and vomiting.  You have contractions of your uterus.  You have a gush of fluid from the vagina. MAKE SURE YOU:  Understand these instructions.   Will watch your condition.   Will get help right away if you are not doing well or get worse.    This information is not intended to replace advice given to you by your health care provider. Make sure you discuss any questions you have with your health care provider.   Document Released: 07/17/2010 Document Revised: 01/10/2013 Document Reviewed: 10/19/2012 Elsevier Interactive Patient Education 2016 Elsevier Inc.   Bacterial Vaginosis Bacterial vaginosis is a vaginal infection that occurs when the normal balance of bacteria in the vagina is disrupted. It results from an overgrowth of certain bacteria. This is the most common vaginal infection in women of childbearing age. Treatment is important to prevent complications, especially in pregnant women, as it can cause a premature delivery. CAUSES  Bacterial vaginosis is caused by an increase in harmful bacteria that are normally present in smaller amounts in the vagina. Several different kinds of bacteria can cause bacterial vaginosis. However, the reason that the condition develops is not fully understood. RISK FACTORS Certain activities or behaviors can put you at an increased risk of developing bacterial vaginosis, including:  Having a new sex partner or multiple sex partners.  Douching.  Using an intrauterine device (IUD) for contraception. Women do not get bacterial vaginosis from toilet seats, bedding, swimming pools, or contact with objects around them. SIGNS AND SYMPTOMS  Some women with bacterial vaginosis have no signs or symptoms. Common symptoms include:  Grey vaginal discharge.  A fishlike odor with discharge, especially after  sexual intercourse.  Itching or burning of the vagina and vulva.  Burning or pain with urination. DIAGNOSIS  Your health care provider will take a medical history and examine the vagina for signs of bacterial vaginosis. A sample of vaginal fluid may be taken. Your health care provider will look at this sample under a microscope to check for bacteria and abnormal cells. A vaginal pH test may also be done.  TREATMENT  Bacterial vaginosis may be treated with antibiotic medicines. These may be given in the form of a pill or a vaginal cream. A second round of antibiotics may be prescribed if the condition comes back after treatment. Because bacterial vaginosis increases your risk for sexually transmitted diseases, getting treated can help reduce your risk for chlamydia, gonorrhea, HIV, and herpes. HOME CARE INSTRUCTIONS   Only take over-the-counter or prescription medicines as directed by your health care provider.  If antibiotic medicine was prescribed, take it as directed. Make sure you finish it even if you start to feel better.  Tell all sexual partners that you have a vaginal infection. They should see their health care provider and be treated if they have problems, such as a mild rash or itching.  During treatment, it is important that you follow  these instructions:  Avoid sexual activity or use condoms correctly.  Do not douche.  Avoid alcohol as directed by your health care provider.  Avoid breastfeeding as directed by your health care provider. SEEK MEDICAL CARE IF:   Your symptoms are not improving after 3 days of treatment.  You have increased discharge or pain.  You have a fever. MAKE SURE YOU:   Understand these instructions.  Will watch your condition.  Will get help right away if you are not doing well or get worse. FOR MORE INFORMATION  Centers for Disease Control and Prevention, Division of STD Prevention: [employer] American Sexual Health Association  (ASHA): www.ashastd.org    This information is not intended to replace advice given to you by your health care provider. Make sure you discuss any questions you have with your health care provider.   Document Released: 03/22/2005 Document Revised: 04/12/2014 Document Reviewed: 11/01/2012 Elsevier Interactive Patient Education [employer]

## 2016-01-05 LAB — GC/CHLAMYDIA PROBE AMP (~~LOC~~) NOT AT ARMC
Chlamydia: NEGATIVE
NEISSERIA GONORRHEA: NEGATIVE

## 2016-01-05 LAB — CULTURE, OB URINE

## 2016-01-19 ENCOUNTER — Ambulatory Visit (INDEPENDENT_AMBULATORY_CARE_PROVIDER_SITE_OTHER): Payer: Medicaid Other | Admitting: Obstetrics and Gynecology

## 2016-01-19 VITALS — BP 105/69 | HR 84 | Temp 99.1°F | Wt 212.0 lb

## 2016-01-19 DIAGNOSIS — Z3490 Encounter for supervision of normal pregnancy, unspecified, unspecified trimester: Secondary | ICD-10-CM

## 2016-01-19 DIAGNOSIS — O09219 Supervision of pregnancy with history of pre-term labor, unspecified trimester: Secondary | ICD-10-CM

## 2016-01-19 DIAGNOSIS — D649 Anemia, unspecified: Secondary | ICD-10-CM | POA: Diagnosis not present

## 2016-01-19 DIAGNOSIS — O99013 Anemia complicating pregnancy, third trimester: Secondary | ICD-10-CM | POA: Diagnosis not present

## 2016-01-19 DIAGNOSIS — O09899 Supervision of other high risk pregnancies, unspecified trimester: Secondary | ICD-10-CM

## 2016-01-19 NOTE — Progress Notes (Signed)
Subjective:  Hannah Stone is a 25 y.o. G2P0101 at 151w6d being seen today for ongoing prenatal care.  She is currently monitored for the following issues for this high-risk pregnancy and has Supervision of normal pregnancy, antepartum; History of preterm delivery, currently pregnant; Late prenatal care affecting pregnancy in second trimester, antepartum; Anemia in pregnancy; and BMI 31.0-31.9,adult on her problem list.  Patient reports no complaints.  Contractions: Not present. Vag. Bleeding: None.  Movement: Present. Denies leaking of fluid.   The following portions of the patient's history were reviewed and updated as appropriate: allergies, current medications, past family history, past medical history, past social history, past surgical history and problem list. Problem list updated.  Objective:   Vitals:   01/19/16 1531  BP: 105/69  Pulse: 84  Temp: 99.1 F (37.3 C)  Weight: 212 lb (96.2 kg)    Fetal Status:     Movement: Present     General:  Alert, oriented and cooperative. Patient is in no acute distress.  Skin: Skin is warm and dry. No rash noted.   Cardiovascular: Normal heart rate noted  Respiratory: Normal respiratory effort, no problems with respiration noted  Abdomen: Soft, gravid, appropriate for gestational age. Pain/Pressure: Absent     Pelvic:  Cervical exam deferred        Extremities: Normal range of motion.  Edema: None  Mental Status: Normal mood and affect. Normal behavior. Normal judgment and thought content.   Urinalysis:      Assessment and Plan:  Pregnancy: G2P0101 at 11051w6d Stable. GBS a next visit. Continue with PNV and iron There are no diagnoses linked to this encounter. Preterm labor symptoms and general obstetric precautions including but not limited to vaginal bleeding, contractions, leaking of fluid and fetal movement were reviewed in detail with the patient. Please refer to After Visit Summary for other counseling recommendations.   Return in about 1 week (around 01/26/2016) for OB visit.   Hermina StaggersMichael L Gurshan Settlemire, MD

## 2016-01-19 NOTE — Progress Notes (Signed)
Patient reports good fetal movement, denies contractions. 

## 2016-01-28 ENCOUNTER — Ambulatory Visit (INDEPENDENT_AMBULATORY_CARE_PROVIDER_SITE_OTHER): Payer: Medicaid Other | Admitting: Obstetrics and Gynecology

## 2016-01-28 VITALS — BP 123/75 | HR 86 | Temp 98.9°F | Wt 212.0 lb

## 2016-01-28 DIAGNOSIS — O09899 Supervision of other high risk pregnancies, unspecified trimester: Secondary | ICD-10-CM

## 2016-01-28 DIAGNOSIS — D649 Anemia, unspecified: Secondary | ICD-10-CM

## 2016-01-28 DIAGNOSIS — O99013 Anemia complicating pregnancy, third trimester: Secondary | ICD-10-CM | POA: Diagnosis not present

## 2016-01-28 DIAGNOSIS — Z3480 Encounter for supervision of other normal pregnancy, unspecified trimester: Secondary | ICD-10-CM

## 2016-01-28 DIAGNOSIS — O09219 Supervision of pregnancy with history of pre-term labor, unspecified trimester: Secondary | ICD-10-CM

## 2016-01-28 DIAGNOSIS — Z3483 Encounter for supervision of other normal pregnancy, third trimester: Secondary | ICD-10-CM

## 2016-01-28 NOTE — Progress Notes (Signed)
Patient is in the office for routine visit, pt reports feeling pressure but no bleeding. Denies contractions, and reports good fetal movement.

## 2016-01-28 NOTE — Progress Notes (Signed)
Subjective:  Hannah Stone is a 25 y.o. G2P0101 at 455w1d being seen today for ongoing prenatal care.  She is currently monitored for the following issues for this low-risk pregnancy and has Supervision of normal pregnancy, antepartum; History of preterm delivery, currently pregnant; Late prenatal care affecting pregnancy in second trimester, antepartum; Anemia in pregnancy; and BMI 31.0-31.9,adult on her problem list.  Patient reports no complaints.  Contractions: Not present. Vag. Bleeding: None.  Movement: Present. Denies leaking of fluid.   The following portions of the patient's history were reviewed and updated as appropriate: allergies, current medications, past family history, past medical history, past social history, past surgical history and problem list. Problem list updated.  Objective:   Vitals:   01/28/16 1512  BP: 123/75  Pulse: 86  Temp: 98.9 F (37.2 C)  Weight: 212 lb (96.2 kg)    Fetal Status: Fetal Heart Rate (bpm): 138 Fundal Height: 36 cm Movement: Present     General:  Alert, oriented and cooperative. Patient is in no acute distress.  Skin: Skin is warm and dry. No rash noted.   Cardiovascular: Normal heart rate noted  Respiratory: Normal respiratory effort, no problems with respiration noted  Abdomen: Soft, gravid, appropriate for gestational age. Pain/Pressure: Present     Pelvic:  Cervical exam performed        Extremities: Normal range of motion.  Edema: None  Mental Status: Normal mood and affect. Normal behavior. Normal judgment and thought content.   Urinalysis:      Assessment and Plan:  Pregnancy: G2P0101 at 3755w1d  1. Supervision of other normal pregnancy, antepartum GBS today  2. History of preterm delivery, currently pregnant Declined 17 OHP  3. Anemia during pregnancy in third trimester Taking iron supplement and PNV  Term labor symptoms and general obstetric precautions including but not limited to vaginal bleeding,  contractions, leaking of fluid and fetal movement were reviewed in detail with the patient. Please refer to After Visit Summary for other counseling recommendations.  Return in about 1 week (around 02/04/2016) for OB visit.   Hermina StaggersMichael L Daimien Patmon, MD

## 2016-01-28 NOTE — Addendum Note (Signed)
Addended by: Natale MilchSTALLING, Maimuna Leaman D on: 01/28/2016 04:50 PM   Modules accepted: Orders

## 2016-01-28 NOTE — Patient Instructions (Signed)
Vaginal Delivery °During delivery, your health care provider will help you give birth to your baby. During a vaginal delivery, you will work to push the baby out of your vagina. However, before you can push your baby out, a few things need to happen. The opening of your uterus (cervix) has to soften, thin out, and open up (dilate) all the way to 10 cm. Also, your baby has to move down from the uterus into your vagina.  °SIGNS OF LABOR  °Your health care provider will first need to make sure you are in labor. Signs of labor include:  °· Passing what is called the mucous plug before labor begins. This is a small amount of blood-stained mucus. °· Having regular, painful uterine contractions.   °· The time between contractions gets shorter.   °· The discomfort and pain gradually get more intense. °· Contraction pains get worse when walking and do not go away when resting.   °· Your cervix becomes thinner (effacement) and dilates. °BEFORE THE DELIVERY °Once you are in labor and admitted into the hospital or care center, your health care provider may do the following:  °· Perform a complete physical exam. °· Review any complications related to pregnancy or labor.  °· Check your blood pressure, pulse, temperature, and heart rate (vital signs).   °· Determine if, and when, the rupture of amniotic membranes occurred. °· Do a vaginal exam (using a sterile glove and lubricant) to determine:   °¨ The position (presentation) of the baby. Is the baby's head presenting first (vertex) in the birth canal (vagina), or are the feet or buttocks first (breech)?   °¨ The level (station) of the baby's head within the birth canal.   °¨ The effacement and dilatation of the cervix.   °· An electronic fetal monitor is usually placed on your abdomen when you first arrive. This is used to monitor your contractions and the baby's heart rate. °¨ When the monitor is on your abdomen (external fetal monitor), it can only pick up the frequency and  length of your contractions. It cannot tell the strength of your contractions. °¨ If it becomes necessary for your health care provider to know exactly how strong your contractions are or to see exactly what the baby's heart rate is doing, an internal monitor may be inserted into your vagina and uterus. Your health care provider will discuss the benefits and risks of using an internal monitor and obtain your permission before inserting the device. °¨ Continuous fetal monitoring may be needed if you have an epidural, are receiving certain medicines (such as oxytocin), or have pregnancy or labor complications. °· An IV access tube may be placed into a vein in your arm to deliver fluids and medicines if necessary. °THREE STAGES OF LABOR AND DELIVERY °Normal labor and delivery is divided into three stages. °First Stage °This stage starts when you begin to contract regularly and your cervix begins to efface and dilate. It ends when your cervix is completely open (fully dilated). The first stage is the longest stage of labor and can last from 3 hours to 15 hours.  °Several methods are available to help with labor pain. You and your health care provider will decide which option is best for you. Options include:  °· Opioid medicines. These are strong pain medicines that you can get through your IV tube or as a shot into your muscle. These medicines lessen pain but do not make it go away completely.  °· Epidural. A medicine is given through a thin tube that   is inserted in your back. The medicine numbs the lower part of your body and prevents any pain in that area. °· Paracervical pain medicine. This is an injection of an anesthetic on each side of your cervix.   °· You may request natural childbirth, which does not involve the use of pain medicines or an epidural during labor and delivery. Instead, you will use other things, such as breathing exercises, to help cope with the pain. °Second Stage °The second stage of labor  begins when your cervix is fully dilated at 10 cm. It continues until you push your baby down through the birth canal and the baby is born. This stage can take only minutes or several hours. °· The location of your baby's head as it moves through the birth canal is reported as a number called a station. If the baby's head has not started its descent, the station is described as being at minus 3 (-3). When your baby's head is at the zero station, it is at the middle of the birth canal and is engaged in the pelvis. The station of your baby helps indicate the progress of the second stage of labor. °· When your baby is born, your health care provider may hold the baby with his or her head lowered to prevent amniotic fluid, mucus, and blood from getting into the baby's lungs. The baby's mouth and nose may be suctioned with a small bulb syringe to remove any additional fluid. °· Your health care provider may then place the baby on your stomach. It is important to keep the baby from getting cold. To do this, the health care provider will dry the baby off, place the baby directly on your skin (with no blankets between you and the baby), and cover the baby with warm, dry blankets.   °· The umbilical cord is cut. °Third Stage °During the third stage of labor, your health care provider will deliver the placenta (afterbirth) and make sure your bleeding is under control. The delivery of the placenta usually takes about 5 minutes but can take up to 30 minutes. After the placenta is delivered, a medicine may be given either by IV or injection to help contract the uterus and control bleeding. If you are planning to breastfeed, you can try to do so now. °After you deliver the placenta, your uterus should contract and get very firm. If your uterus does not remain firm, your health care provider will massage it. This is important because the contraction of the uterus helps cut off bleeding at the site where the placenta was attached  to your uterus. If your uterus does not contract properly and stay firm, you may continue to bleed heavily. If there is a lot of bleeding, medicines may be given to contract the uterus and stop the bleeding.  °  °This information is not intended to replace advice given to you by your health care provider. Make sure you discuss any questions you have with your health care provider. °  °Document Released: 12/30/2007 Document Revised: 04/12/2014 Document Reviewed: 11/17/2011 °Elsevier Interactive Patient Education ©2016 [employer] ° °

## 2016-01-29 LAB — OB RESULTS CONSOLE GBS: STREP GROUP B AG: POSITIVE

## 2016-01-30 LAB — STREP GP B NAA: Strep Gp B NAA: POSITIVE — AB

## 2016-01-30 LAB — PLEASE NOTE

## 2016-02-05 ENCOUNTER — Encounter: Payer: Medicaid Other | Admitting: Advanced Practice Midwife

## 2016-02-09 ENCOUNTER — Ambulatory Visit (INDEPENDENT_AMBULATORY_CARE_PROVIDER_SITE_OTHER): Payer: Medicaid Other | Admitting: Obstetrics

## 2016-02-09 ENCOUNTER — Encounter: Payer: Self-pay | Admitting: Obstetrics

## 2016-02-09 VITALS — BP 119/73 | HR 93 | Temp 97.5°F | Wt 216.2 lb

## 2016-02-09 DIAGNOSIS — Z3483 Encounter for supervision of other normal pregnancy, third trimester: Secondary | ICD-10-CM | POA: Diagnosis not present

## 2016-02-09 DIAGNOSIS — K219 Gastro-esophageal reflux disease without esophagitis: Secondary | ICD-10-CM

## 2016-02-09 DIAGNOSIS — Z3480 Encounter for supervision of other normal pregnancy, unspecified trimester: Secondary | ICD-10-CM

## 2016-02-09 MED ORDER — RANITIDINE HCL 150 MG PO TABS: 150.0000 mg | ORAL_TABLET | Freq: Two times a day (BID) | ORAL | 5 refills | Status: DC

## 2016-02-09 NOTE — Progress Notes (Signed)
Subjective:    Hannah Stone is a 25 y.o. female being seen today for her obstetrical visit. She is at 6543w6d gestation. Patient reports backache and heartburn. Fetal movement: normal.  Problem List Items Addressed This Visit    Supervision of normal pregnancy, antepartum - Primary    Other Visit Diagnoses    GERD without esophagitis       Relevant Medications   ranitidine (ZANTAC) 150 MG tablet     Patient Active Problem List   Diagnosis Date Noted  . BMI 31.0-31.9,adult 10/16/2015  . Anemia in pregnancy 10/15/2015  . Supervision of normal pregnancy, antepartum 10/14/2015  . History of preterm delivery, currently pregnant 10/14/2015  . Late prenatal care affecting pregnancy in second trimester, antepartum 10/14/2015    Objective:    BP 119/73   Pulse 93   Temp 97.5 F (36.4 C)   Wt 216 lb 3.2 oz (98.1 kg)   LMP 05/20/2015 (Exact Date)   BMI 34.37 kg/m  FHT: 140 BPM  Uterine Size: size equals dates  Presentations: Cephalic      Assessment:    Pregnancy @ 4643w6d weeks    Backache.  Probable OP vertex position.  Heartburn  Plan:   Plans for delivery: Vaginal anticipated; labs reviewed; problem list updated Counseling: Consent signed. Infant feeding: plans to breastfeed. Cigarette smoking: former smoker. L&D discussion: symptoms of labor, discussed when to call, discussed what number to call, anesthetic/analgesic options reviewed and delivering clinician:  plans no preference. Postpartum supports and preparation: circumcision discussed and contraception plans discussed.  Follow up in 1 Week.

## 2016-02-09 NOTE — Progress Notes (Signed)
Patient c/o pelvic& back pain

## 2016-02-16 ENCOUNTER — Inpatient Hospital Stay (HOSPITAL_COMMUNITY)
Admission: AD | Admit: 2016-02-16 | Discharge: 2016-02-16 | Disposition: A | Discharge status: Home / Self Care | Payer: Medicaid Other | Source: Ambulatory Visit | Attending: Family Medicine | Admitting: Family Medicine

## 2016-02-16 ENCOUNTER — Encounter (HOSPITAL_COMMUNITY): Payer: Self-pay

## 2016-02-16 ENCOUNTER — Encounter: Payer: Self-pay | Admitting: Obstetrics

## 2016-02-16 ENCOUNTER — Ambulatory Visit (INDEPENDENT_AMBULATORY_CARE_PROVIDER_SITE_OTHER): Payer: Medicaid Other | Admitting: Obstetrics

## 2016-02-16 VITALS — BP 111/72 | HR 84 | Temp 97.2°F | Wt 219.1 lb

## 2016-02-16 DIAGNOSIS — Z87891 Personal history of nicotine dependence: Secondary | ICD-10-CM | POA: Insufficient documentation

## 2016-02-16 DIAGNOSIS — O09213 Supervision of pregnancy with history of pre-term labor, third trimester: Secondary | ICD-10-CM

## 2016-02-16 DIAGNOSIS — Z3A38 38 weeks gestation of pregnancy: Secondary | ICD-10-CM | POA: Diagnosis not present

## 2016-02-16 DIAGNOSIS — N898 Other specified noninflammatory disorders of vagina: Secondary | ICD-10-CM | POA: Diagnosis present

## 2016-02-16 DIAGNOSIS — O09893 Supervision of other high risk pregnancies, third trimester: Secondary | ICD-10-CM

## 2016-02-16 DIAGNOSIS — Z3490 Encounter for supervision of normal pregnancy, unspecified, unspecified trimester: Secondary | ICD-10-CM

## 2016-02-16 DIAGNOSIS — O26893 Other specified pregnancy related conditions, third trimester: Secondary | ICD-10-CM | POA: Diagnosis not present

## 2016-02-16 LAB — WET PREP, GENITAL
CLUE CELLS WET PREP: NONE SEEN
Sperm: NONE SEEN
Trich, Wet Prep: NONE SEEN
YEAST WET PREP: NONE SEEN

## 2016-02-16 LAB — POCT FERN TEST: POCT FERN TEST: NEGATIVE

## 2016-02-16 NOTE — MAU Provider Note (Signed)
History   [id number]   Chief Complaint  Patient presents with  . Contractions    HPI Hannah Stone is a 25 y.o. female  G2P0101 here with report of watery vaginal discharge that began sometime last night. Feels like her underwear has been wet but can't tell what it looks like. No odor. Denies vaginal bleeding. Contractions this morning every 6 minutes. Positive fetal movement.    Patient's last menstrual period was 05/20/2015 (exact date).  OB History  Gravida Para Term Preterm AB Living  2 1 0 1 0 1  SAB TAB Ectopic Multiple Live Births  0 0 0 0 1    # Outcome Date GA Lbr Len/2nd Weight Sex Delivery Anes PTL Lv  2 Current           1 Preterm 07/08/11 568w3d 36:12 / 00:44 5 lb 12.4 oz (2.62 kg) F Vag-Spont EPI  LIV     Birth Comments: WNL      Past Medical History:  Diagnosis Date  . Asthma     History reviewed. No pertinent family history.  Social History   Social History  . Marital status: Single    Spouse name: N/A  . Number of children: N/A  . Years of education: N/A   Social History Main Topics  . Smoking status: Former Games developermoker  . Smokeless tobacco: Never Used  . Alcohol use No     Comment: Occas. when not preg.  . Drug use: No  . Sexual activity: Yes    Birth control/ protection: None     Comment: 1 week ago   Other Topics Concern  . None   Social History Narrative  . None    No Known Allergies  No current facility-administered medications on file prior to encounter.    Current Outpatient Prescriptions on File Prior to Encounter  Medication Sig Dispense Refill  . ferrous sulfate (FERROUSUL) 325 (65 FE) MG tablet Take 1 tablet (325 mg total) by mouth 2 (two) times daily. 60 tablet 1  . loratadine (CLARITIN) 10 MG tablet Take 10 mg by mouth daily.    . Prenatal Vit-Fe Fumarate-FA (PRENATAL MULTIVITAMIN) TABS tablet Take 1 tablet by mouth daily at 12 noon. 30 tablet 6  . ranitidine (ZANTAC) 150 MG tablet Take 1 tablet (150 mg total) by  mouth 2 (two) times daily. 60 tablet 5     Review of Systems  Gastrointestinal: Positive for abdominal pain.  Genitourinary: Positive for vaginal discharge. Negative for vaginal bleeding.     Physical Exam   Vitals:   02/16/16 1006  BP: 128/67  Pulse: 75  Resp: 16  Temp: 98 F (36.7 C)    Physical Exam  Nursing note and vitals reviewed. Constitutional: She is oriented to person, place, and time. She appears well-developed and well-nourished. No distress.  HENT:  Head: Normocephalic and atraumatic.  Eyes: Conjunctivae are normal. Right eye exhibits no discharge. Left eye exhibits no discharge. No scleral icterus.  Neck: Normal range of motion.  Cardiovascular:  No murmur heard. Respiratory: Effort normal. No respiratory distress.  Genitourinary: No bleeding in the vagina. Vaginal discharge (small amount of thin white discharge) found.  Genitourinary Comments: No pooling  Neurological: She is alert and oriented to person, place, and time.  Skin: Skin is warm and dry. She is not diaphoretic.  Psychiatric: She has a normal mood and affect. Her behavior is normal. Judgment and thought content normal.   Dilation: 1 Effacement (%): Thick Station: [employer] Exam  by:: Judeth HornErin Kennedy Bohanon NP  Fetal Tracing:  Baseline: 125 Variability: moderate Accelerations: 15x15 Decelerations: none  Toco: x2  MAU Course  Procedures Results for orders placed or performed during the hospital encounter of 02/16/16 (from the past 24 hour(s))  Wet prep, genital     Status: Abnormal   Collection Time: 02/16/16 10:30 AM  Result Value Ref Range   Yeast Wet Prep HPF POC NONE SEEN NONE SEEN   Trich, Wet Prep NONE SEEN NONE SEEN   Clue Cells Wet Prep HPF POC NONE SEEN NONE SEEN   WBC, Wet Prep HPF POC MODERATE (A) NONE SEEN   Sperm NONE SEEN   Fern Test     Status: None   Collection Time: 02/16/16 10:38 AM  Result Value Ref Range   POCT Fern Test Negative = intact amniotic membranes      MDM Category 1 tracing No pooling, fern negative  Assessment and Plan  A: 1. Vaginal discharge during pregnancy in third trimester    P: Discharge home [facility] -- keep appt Discussed reasons to return to MAU  Judeth HornErin Allaya Abbasi, NP 02/16/2016 10:21 AM

## 2016-02-16 NOTE — MAU Note (Signed)
Pt feeling contractions about 6 minutes apart. Feels like she is leaking fluid.

## 2016-02-16 NOTE — Progress Notes (Signed)
Subjective:    Hannah Stone is a 25 y.o. female being seen today for her obstetrical visit. She is at 8445w6d gestation. Patient reports heartburn. Fetal movement: normal.  Problem List Items Addressed This Visit    None     Patient Active Problem List   Diagnosis Date Noted  . BMI 31.0-31.9,adult 10/16/2015  . Anemia in pregnancy 10/15/2015  . Supervision of normal pregnancy, antepartum 10/14/2015  . History of preterm delivery, currently pregnant 10/14/2015  . Late prenatal care affecting pregnancy in second trimester, antepartum 10/14/2015    Objective:    BP 111/72   Pulse 84   Temp 97.2 F (36.2 C)   Wt 219 lb 1.6 oz (99.4 kg)   LMP 05/20/2015 (Exact Date)   BMI 34.83 kg/m  FHT: 140 BPM    Assessment:    Pregnancy @ 5145w6d weeks   Plan:   Plans for delivery: Vaginal anticipated; labs reviewed; problem list updated Counseling: Consent signed. Infant feeding: plans to breastfeed. Cigarette smoking: former smoker. L&D discussion: symptoms of labor, discussed when to call, discussed what number to call, anesthetic/analgesic options reviewed and delivering clinician:  plans no preference. Postpartum supports and preparation: circumcision discussed and contraception plans discussed.  Follow up in 1 Week.

## 2016-02-23 ENCOUNTER — Ambulatory Visit (INDEPENDENT_AMBULATORY_CARE_PROVIDER_SITE_OTHER): Payer: Medicaid Other | Admitting: Certified Nurse Midwife

## 2016-02-23 DIAGNOSIS — O480 Post-term pregnancy: Secondary | ICD-10-CM

## 2016-02-23 DIAGNOSIS — Z3483 Encounter for supervision of other normal pregnancy, third trimester: Secondary | ICD-10-CM

## 2016-02-23 DIAGNOSIS — Z3480 Encounter for supervision of other normal pregnancy, unspecified trimester: Secondary | ICD-10-CM

## 2016-02-23 NOTE — Progress Notes (Signed)
Subjective:    Janne Iva Booporvell J Stratmann is a 25 y.o. female being seen today for her obstetrical visit. She is at 733w6d gestation. Patient reports no bleeding, no leaking and occasional contractions. Fetal movement: normal.  Problem List Items Addressed This Visit      Other   Supervision of normal pregnancy, antepartum     Patient Active Problem List   Diagnosis Date Noted  . BMI 31.0-31.9,adult 10/16/2015  . Anemia in pregnancy 10/15/2015  . Supervision of normal pregnancy, antepartum 10/14/2015  . History of preterm delivery, currently pregnant 10/14/2015  . Late prenatal care affecting pregnancy in second trimester, antepartum 10/14/2015    Objective:    BP 124/83   Pulse 82   LMP 05/20/2015 (Exact Date)  FHT: 150 BPM  Uterine Size: 40 cm and size equals dates  Presentations: cephalic  Pelvic Exam:              Dilation: Closed       Effacement: Long             Station:  -3    Consistency: firm            Position: posterior    NST: + accels, no decels, moderate variability, Cat. 1 tracing. Occasional mild contractions on toco.    Assessment:    Pregnancy @ 2533w6d weeks   reactive NST Plan:   Plans for delivery: Vaginal anticipated; labs reviewed; problem list updated Counseling: Consent signed. Infant feeding: plans to breastfeed. Cigarette smoking: never smoked. L&D discussion: symptoms of labor, discussed when to call, discussed what number to call, anesthetic/analgesic options reviewed and delivering clinician:  plans no preference. Postpartum supports and preparation: circumcision discussed and contraception plans discussed. IOL scheduled for  03/01/16@0700 .   Follow up postpartum.

## 2016-02-23 NOTE — Progress Notes (Signed)
Pt is having sharp lower pelvic pains. Pt scheduled for IOL, 03/01/16 at 7am.

## 2016-02-24 ENCOUNTER — Telehealth (HOSPITAL_COMMUNITY): Payer: Self-pay | Admitting: *Deleted

## 2016-02-24 NOTE — Telephone Encounter (Signed)
Preadmission screen  

## 2016-02-29 ENCOUNTER — Inpatient Hospital Stay (HOSPITAL_COMMUNITY): Payer: Medicaid Other | Admitting: Anesthesiology

## 2016-02-29 ENCOUNTER — Inpatient Hospital Stay (HOSPITAL_COMMUNITY)
Admission: AD | Admit: 2016-02-29 | Discharge: 2016-03-02 | DRG: 775 | Disposition: A | Discharge status: Home / Self Care | Payer: Medicaid Other | Source: Ambulatory Visit | Attending: Obstetrics & Gynecology | Admitting: Obstetrics & Gynecology

## 2016-02-29 ENCOUNTER — Encounter (HOSPITAL_COMMUNITY): Payer: Self-pay | Admitting: *Deleted

## 2016-02-29 DIAGNOSIS — Z3A40 40 weeks gestation of pregnancy: Secondary | ICD-10-CM | POA: Diagnosis not present

## 2016-02-29 DIAGNOSIS — O9081 Anemia of the puerperium: Secondary | ICD-10-CM | POA: Diagnosis not present

## 2016-02-29 DIAGNOSIS — O99824 Streptococcus B carrier state complicating childbirth: Secondary | ICD-10-CM | POA: Diagnosis present

## 2016-02-29 DIAGNOSIS — Z87891 Personal history of nicotine dependence: Secondary | ICD-10-CM

## 2016-02-29 DIAGNOSIS — D649 Anemia, unspecified: Secondary | ICD-10-CM | POA: Diagnosis not present

## 2016-02-29 DIAGNOSIS — Z3493 Encounter for supervision of normal pregnancy, unspecified, third trimester: Secondary | ICD-10-CM | POA: Diagnosis present

## 2016-02-29 DIAGNOSIS — O480 Post-term pregnancy: Secondary | ICD-10-CM | POA: Diagnosis not present

## 2016-02-29 DIAGNOSIS — O479 False labor, unspecified: Secondary | ICD-10-CM | POA: Diagnosis present

## 2016-02-29 LAB — CBC
HEMATOCRIT: 29.3 % — AB (ref 36.0–46.0)
HEMOGLOBIN: 9.9 g/dL — AB (ref 12.0–15.0)
MCH: 28.9 pg (ref 26.0–34.0)
MCHC: 33.8 g/dL (ref 30.0–36.0)
MCV: 85.7 fL (ref 78.0–100.0)
Platelets: 289 10*3/uL (ref 150–400)
RBC: 3.42 MIL/uL — ABNORMAL LOW (ref 3.87–5.11)
RDW: 15.7 % — AB (ref 11.5–15.5)
WBC: 13.9 10*3/uL — ABNORMAL HIGH (ref 4.0–10.5)

## 2016-02-29 LAB — TYPE AND SCREEN
ABO/RH(D): A POS
Antibody Screen: NEGATIVE

## 2016-02-29 LAB — ABO/RH: ABO/RH(D): A POS

## 2016-02-29 LAB — RPR: RPR Ser Ql: NONREACTIVE

## 2016-02-29 MED ORDER — EPHEDRINE 5 MG/ML INJ
10.0000 mg | INTRAVENOUS | Status: DC | PRN
  Filled 2016-02-29: qty 4

## 2016-02-29 MED ORDER — LACTATED RINGERS IV SOLN: 500.0000 mL | INTRAVENOUS | Status: DC | PRN

## 2016-02-29 MED ORDER — DIPHENHYDRAMINE HCL 50 MG/ML IJ SOLN: 12.5000 mg | INTRAMUSCULAR | Status: DC | PRN

## 2016-02-29 MED ORDER — TETANUS-DIPHTH-ACELL PERTUSSIS 5-2.5-18.5 LF-MCG/0.5 IM SUSP: 0.5000 mL | Freq: Once | INTRAMUSCULAR | Status: DC

## 2016-02-29 MED ORDER — MEASLES, MUMPS & RUBELLA VAC ~~LOC~~ INJ: 0.5000 mL | INJECTION | Freq: Once | SUBCUTANEOUS | Status: DC

## 2016-02-29 MED ORDER — LACTATED RINGERS IV SOLN
INTRAVENOUS | Status: DC
  Administered 2016-02-29: 09:00:00 via INTRAVENOUS

## 2016-02-29 MED ORDER — LACTATED RINGERS IV SOLN
INTRAVENOUS | Status: DC
  Administered 2016-02-29: 14:00:00 via INTRAVENOUS

## 2016-02-29 MED ORDER — SOD CITRATE-CITRIC ACID 500-334 MG/5ML PO SOLN
30.0000 mL | ORAL | Status: DC | PRN
  Administered 2016-02-29: 30 mL via ORAL

## 2016-02-29 MED ORDER — OXYCODONE-ACETAMINOPHEN 5-325 MG PO TABS: 1.0000 | ORAL_TABLET | ORAL | Status: DC | PRN

## 2016-02-29 MED ORDER — OXYTOCIN 40 UNITS IN LACTATED RINGERS INFUSION - SIMPLE MED
2.5000 [IU]/h | INTRAVENOUS | Status: DC
  Filled 2016-02-29: qty 1000

## 2016-02-29 MED ORDER — OXYTOCIN BOLUS FROM INFUSION: 500.0000 mL | Freq: Once | INTRAVENOUS | Status: DC

## 2016-02-29 MED ORDER — METHYLERGONOVINE MALEATE 0.2 MG PO TABS: 0.2000 mg | ORAL_TABLET | ORAL | Status: DC | PRN

## 2016-02-29 MED ORDER — PHENYLEPHRINE 40 MCG/ML (10ML) SYRINGE FOR IV PUSH (FOR BLOOD PRESSURE SUPPORT)
80.0000 ug | PREFILLED_SYRINGE | INTRAVENOUS | Status: DC | PRN
  Filled 2016-02-29: qty 10
  Filled 2016-02-29: qty 5

## 2016-02-29 MED ORDER — PENICILLIN G POTASSIUM 5000000 UNITS IJ SOLR
5.0000 10*6.[IU] | Freq: Once | INTRAMUSCULAR | Status: AC
  Administered 2016-02-29: 5 10*6.[IU] via INTRAVENOUS
  Filled 2016-02-29: qty 5

## 2016-02-29 MED ORDER — DIBUCAINE 1 % RE OINT: 1.0000 "application " | TOPICAL_OINTMENT | RECTAL | Status: DC | PRN

## 2016-02-29 MED ORDER — SENNOSIDES-DOCUSATE SODIUM 8.6-50 MG PO TABS
2.0000 | ORAL_TABLET | ORAL | Status: DC
  Administered 2016-02-29 – 2016-03-01 (×2): 2 via ORAL
  Filled 2016-02-29 (×2): qty 2

## 2016-02-29 MED ORDER — ONDANSETRON HCL 4 MG/2ML IJ SOLN: 4.0000 mg | INTRAMUSCULAR | Status: DC | PRN

## 2016-02-29 MED ORDER — BENZOCAINE-MENTHOL 20-0.5 % EX AERO
1.0000 "application " | INHALATION_SPRAY | CUTANEOUS | Status: DC | PRN
  Administered 2016-02-29: 1 via TOPICAL
  Filled 2016-02-29: qty 56

## 2016-02-29 MED ORDER — FENTANYL CITRATE (PF) 100 MCG/2ML IJ SOLN: 100.0000 ug | INTRAMUSCULAR | Status: DC | PRN

## 2016-02-29 MED ORDER — TERBUTALINE SULFATE 1 MG/ML IJ SOLN
0.2500 mg | Freq: Once | INTRAMUSCULAR | Status: DC | PRN
Start: 2016-02-29 — End: 2016-02-29
  Filled 2016-02-29: qty 1

## 2016-02-29 MED ORDER — OXYTOCIN 40 UNITS IN LACTATED RINGERS INFUSION - SIMPLE MED: 2.5000 [IU]/h | INTRAVENOUS | Status: DC | PRN

## 2016-02-29 MED ORDER — ACETAMINOPHEN 325 MG PO TABS
650.0000 mg | ORAL_TABLET | ORAL | Status: DC | PRN
  Administered 2016-02-29 – 2016-03-02 (×5): 650 mg via ORAL
  Filled 2016-02-29 (×5): qty 2

## 2016-02-29 MED ORDER — LACTATED RINGERS IV SOLN: 500.0000 mL | Freq: Once | INTRAVENOUS | Status: DC

## 2016-02-29 MED ORDER — SIMETHICONE 80 MG PO CHEW
80.0000 mg | CHEWABLE_TABLET | ORAL | Status: DC | PRN
  Administered 2016-03-01: 80 mg via ORAL
  Filled 2016-02-29: qty 1

## 2016-02-29 MED ORDER — ACETAMINOPHEN 325 MG PO TABS
650.0000 mg | ORAL_TABLET | ORAL | Status: DC | PRN
  Administered 2016-02-29: 650 mg via ORAL

## 2016-02-29 MED ORDER — ONDANSETRON HCL 4 MG/2ML IJ SOLN
4.0000 mg | Freq: Four times a day (QID) | INTRAMUSCULAR | Status: DC | PRN
  Filled 2016-02-29: qty 2

## 2016-02-29 MED ORDER — OXYCODONE HCL 5 MG PO TABS
5.0000 mg | ORAL_TABLET | ORAL | Status: DC | PRN
  Administered 2016-03-01 – 2016-03-02 (×2): 5 mg via ORAL
  Filled 2016-02-29 (×3): qty 1

## 2016-02-29 MED ORDER — COCONUT OIL OIL: 1.0000 "application " | TOPICAL_OIL | Status: DC | PRN

## 2016-02-29 MED ORDER — WITCH HAZEL-GLYCERIN EX PADS: 1.0000 "application " | MEDICATED_PAD | CUTANEOUS | Status: DC | PRN

## 2016-02-29 MED ORDER — OXYTOCIN 40 UNITS IN LACTATED RINGERS INFUSION - SIMPLE MED
1.0000 m[IU]/min | INTRAVENOUS | Status: DC
  Administered 2016-02-29: 2 m[IU]/min via INTRAVENOUS
  Administered 2016-02-29: 12 m[IU]/min via INTRAVENOUS

## 2016-02-29 MED ORDER — OXYTOCIN BOLUS FROM INFUSION
500.0000 mL | Freq: Once | INTRAVENOUS | Status: AC
  Administered 2016-02-29: 500 mL via INTRAVENOUS

## 2016-02-29 MED ORDER — PENICILLIN G POT IN DEXTROSE 60000 UNIT/ML IV SOLN
3.0000 10*6.[IU] | INTRAVENOUS | Status: DC
  Administered 2016-02-29 (×2): 3 10*6.[IU] via INTRAVENOUS
  Filled 2016-02-29 (×4): qty 50

## 2016-02-29 MED ORDER — DIPHENHYDRAMINE HCL 25 MG PO CAPS
25.0000 mg | ORAL_CAPSULE | Freq: Four times a day (QID) | ORAL | Status: DC | PRN
  Administered 2016-02-29: 25 mg via ORAL
  Filled 2016-02-29: qty 1

## 2016-02-29 MED ORDER — LIDOCAINE HCL (PF) 1 % IJ SOLN
30.0000 mL | INTRAMUSCULAR | Status: DC | PRN
  Filled 2016-02-29: qty 30

## 2016-02-29 MED ORDER — ONDANSETRON HCL 4 MG PO TABS: 4.0000 mg | ORAL_TABLET | ORAL | Status: DC | PRN

## 2016-02-29 MED ORDER — LIDOCAINE HCL (PF) 1 % IJ SOLN
INTRAMUSCULAR | Status: DC | PRN
  Administered 2016-02-29: 2 mL via EPIDURAL
  Administered 2016-02-29: 5 mL via EPIDURAL
  Administered 2016-02-29: 3 mL via EPIDURAL

## 2016-02-29 MED ORDER — OXYTOCIN 40 UNITS IN LACTATED RINGERS INFUSION - SIMPLE MED: 2.5000 [IU]/h | INTRAVENOUS | Status: DC

## 2016-02-29 MED ORDER — PRENATAL MULTIVITAMIN CH
1.0000 | ORAL_TABLET | Freq: Every day | ORAL | Status: DC
  Administered 2016-03-01 – 2016-03-02 (×2): 1 via ORAL
  Filled 2016-02-29 (×2): qty 1

## 2016-02-29 MED ORDER — OXYCODONE-ACETAMINOPHEN 5-325 MG PO TABS: 2.0000 | ORAL_TABLET | ORAL | Status: DC | PRN

## 2016-02-29 MED ORDER — FENTANYL 2.5 MCG/ML BUPIVACAINE 1/10 % EPIDURAL INFUSION (WH - ANES)
14.0000 mL/h | INTRAMUSCULAR | Status: DC | PRN
  Administered 2016-02-29 (×2): 14 mL/h via EPIDURAL
  Filled 2016-02-29 (×2): qty 100

## 2016-02-29 MED ORDER — SOD CITRATE-CITRIC ACID 500-334 MG/5ML PO SOLN
30.0000 mL | ORAL | Status: DC | PRN
  Filled 2016-02-29: qty 15

## 2016-02-29 MED ORDER — METHYLERGONOVINE MALEATE 0.2 MG/ML IJ SOLN: 0.2000 mg | INTRAMUSCULAR | Status: DC | PRN

## 2016-02-29 MED ORDER — PHENYLEPHRINE 40 MCG/ML (10ML) SYRINGE FOR IV PUSH (FOR BLOOD PRESSURE SUPPORT)
80.0000 ug | PREFILLED_SYRINGE | INTRAVENOUS | Status: DC | PRN
  Filled 2016-02-29: qty 5
  Filled 2016-02-29: qty 10

## 2016-02-29 MED ORDER — IBUPROFEN 600 MG PO TABS
600.0000 mg | ORAL_TABLET | Freq: Four times a day (QID) | ORAL | Status: DC
  Administered 2016-02-29 – 2016-03-02 (×7): 600 mg via ORAL
  Filled 2016-02-29 (×7): qty 1

## 2016-02-29 MED ORDER — ONDANSETRON HCL 4 MG/2ML IJ SOLN
4.0000 mg | Freq: Four times a day (QID) | INTRAMUSCULAR | Status: DC | PRN
  Administered 2016-02-29: 4 mg via INTRAVENOUS

## 2016-02-29 MED ORDER — ACETAMINOPHEN 325 MG PO TABS
650.0000 mg | ORAL_TABLET | ORAL | Status: DC | PRN
  Filled 2016-02-29: qty 2

## 2016-02-29 MED ORDER — OXYCODONE HCL 5 MG PO TABS
10.0000 mg | ORAL_TABLET | ORAL | Status: DC | PRN
  Administered 2016-03-01 (×2): 10 mg via ORAL
  Filled 2016-02-29 (×2): qty 2

## 2016-02-29 NOTE — MAU Note (Signed)
Notified Philipp DeputyKim Shaw CNM patient is now 5-6/100 bloody show unsure of presentation.

## 2016-02-29 NOTE — Anesthesia Pain Management Evaluation Note (Signed)
  CRNA Pain Management Visit Note  Patient: Hannah AlexanderSherri Norvell J Foell, 25 y.o., female  "Hello I am a member of the anesthesia team at [facility]. We have an anesthesia team available at all times to provide care throughout the hospital, including epidural management and anesthesia for C-section. I don't know your plan for the delivery whether it a natural birth, water birth, IV sedation, nitrous supplementation, doula or epidural, but we want to meet your pain goals."   1.Was your pain managed to your expectations on prior hospitalizations?   Yes   2.What is your expectation for pain management during this hospitalization?     Epidural  3.How can we help you reach that goal? epidural  Record the patient's initial score and the patient's pain goal.   Pain: 4/10  Pain Goal: 4/10 [facility] wants you to be able to say your pain was always managed very well.  Salome ArntSterling, Atavia Poppe Marie 02/29/2016

## 2016-02-29 NOTE — Progress Notes (Signed)
Hannah Stone is a 25 y.o. G2P0101 at 4513w5d by LMP , confirmed by U/S. admitted for active labor  Subjective: Pt "dozing" through ctx with epidural.  Objective: BP 130/66   Pulse 67   Temp 98 F (36.7 C)   Resp 18   Ht 5\' 8"  (1.727 m)   Wt 99 kg (218 lb 3.2 oz)   LMP 05/20/2015 (Exact Date)   SpO2 100%   BMI 33.18 kg/m  No intake/output data recorded. No intake/output data recorded.  FHT:  FHR: 115 bpm, variability: moderate,  accelerations:  Present,  decelerations:  Present variables, lates UC:   regular, every 2-833minutes SVE:   Dilation: 9 Effacement (%): 90 Station: -1 Exam by:: k fields, rn  Labs: Lab Results  Component Value Date   WBC 13.9 (H) 02/29/2016   HGB 9.9 (L) 02/29/2016   HCT 29.3 (L) 02/29/2016   MCV 85.7 02/29/2016   PLT 289 02/29/2016    Assessment / Plan: Spontaneous labor, progressing normally  Labor: Progressing normally Preeclampsia:  no signs or symptoms of toxicity Fetal Wellbeing:  Category II Pain Control:  Epidural I/D:  n/a Anticipated MOD:  NSVD  Clayton BiblesSamantha Weinhold, SNM 02/29/2016, 4:05 PM

## 2016-02-29 NOTE — Progress Notes (Signed)
Confirmed presentation by Philipp DeputyKim Shaw CNM bedside ultrasound vertex.

## 2016-02-29 NOTE — Progress Notes (Signed)
Mauri PoleKathryn Kootria CNM notified of pt's admission and status. Aware of initial elevated B/P and repeat wnl, sve, ctx pattern, reactive FHR. Pt may walk and will reck cervix in hour

## 2016-02-29 NOTE — Anesthesia Preprocedure Evaluation (Signed)
Anesthesia Evaluation  Patient identified by MRN, date of birth, ID band Patient awake    Reviewed: Allergy & Precautions, NPO status , Patient's Chart, lab work & pertinent test results  Airway Mallampati: II  TM Distance: >3 FB Neck ROM: Full    Dental  (+) Teeth Intact, Dental Advisory Given   Pulmonary asthma , former smoker,    Pulmonary exam normal breath sounds clear to auscultation       Cardiovascular negative cardio ROS Normal cardiovascular exam Rhythm:Regular Rate:Normal     Neuro/Psych negative neurological ROS     GI/Hepatic negative GI ROS, Neg liver ROS,   Endo/Other  negative endocrine ROS  Renal/GU negative Renal ROS     Musculoskeletal negative musculoskeletal ROS (+)   Abdominal   Peds  Hematology  (+) Blood dyscrasia, anemia , Plt 289k   Anesthesia Other Findings Day of surgery medications reviewed with the patient.  Reproductive/Obstetrics (+) Pregnancy                             Anesthesia Physical Anesthesia Plan  ASA: II  Anesthesia Plan: Epidural   Post-op Pain Management:    Induction:   Airway Management Planned:   Additional Equipment:   Intra-op Plan:   Post-operative Plan:   Informed Consent: I have reviewed the patients History and Physical, chart, labs and discussed the procedure including the risks, benefits and alternatives for the proposed anesthesia with the patient or authorized representative who has indicated his/her understanding and acceptance.   Dental advisory given  Plan Discussed with:   Anesthesia Plan Comments: (Patient identified. Risks/Benefits/Options discussed with patient including but not limited to bleeding, infection, nerve damage, paralysis, failed block, incomplete pain control, headache, blood pressure changes, nausea, vomiting, reactions to medication both or allergic, itching and postpartum back pain. Confirmed  with bedside nurse the patient's most recent platelet count. Confirmed with patient that they are not currently taking any anticoagulation, have any bleeding history or any family history of bleeding disorders. Patient expressed understanding and wished to proceed. All questions were answered. )        Anesthesia Quick Evaluation

## 2016-02-29 NOTE — H&P (Signed)
Hannah Stone is a 25 y.o. female G2P1001 for onset of labor. GBS pos. OB History    Gravida Para Term Preterm AB Living   2 1 0 1 0 1   SAB TAB Ectopic Multiple Live Births   0 0 0 0 1     Past Medical History:  Diagnosis Date  . Asthma    Past Surgical History:  Procedure Laterality Date  . NO PAST SURGERIES     Family History: family history is not on file. Social History:  reports that she has quit smoking. She has never used smokeless tobacco. She reports that she does not drink alcohol or use drugs.     Maternal Diabetes: No Genetic Screening: Normal Maternal Ultrasounds/Referrals: Normal Fetal Ultrasounds or other Referrals:  None Maternal Substance Abuse:  No Significant Maternal Medications:  None Significant Maternal Lab Results:  Lab values include: Group B Strep positive Other Comments:  None  Review of Systems  Constitutional: Negative.   HENT: Negative.   Eyes: Negative.   Respiratory: Negative.   Cardiovascular: Negative.   Gastrointestinal: Positive for abdominal pain.  Genitourinary: Negative.   Musculoskeletal: Negative.   Skin: Negative.   Neurological: Negative.   Endo/Heme/Allergies: Negative.   Psychiatric/Behavioral: Negative.    Maternal Medical History:  Reason for admission: Contractions.   Contractions: Onset was 6-12 hours ago.   Perceived severity is moderate.    Fetal activity: Perceived fetal activity is normal.   Last perceived fetal movement was within the past hour.    Prenatal complications: no prenatal complications Prenatal Complications - Diabetes: none.    Dilation: 5.5 Effacement (%): 100 Station: Ballotable Exam by:: jolynn Blood pressure (!) 125/59, pulse 83, temperature 97.2 F (36.2 C), temperature source Oral, resp. rate 18, height 5\' 8"  (1.727 m), weight 218 lb 3.2 oz (99 kg), last menstrual period 05/20/2015, unknown if currently breastfeeding. Maternal Exam:  Uterine Assessment: Contraction  strength is moderate.  Contraction frequency is regular.   Abdomen: Patient reports no abdominal tenderness. Fetal presentation: vertex  Introitus: Normal vulva. Normal vagina.  Pelvis: adequate for delivery.   Cervix: Cervix evaluated by digital exam.     Fetal Exam Fetal Monitor Review: Mode: ultrasound.   Variability: moderate (6-25 bpm).   Pattern: accelerations present.    Fetal State Assessment: Category I - tracings are normal.     Physical Exam  Constitutional: She is oriented to person, place, and time. She appears well-developed and well-nourished.  HENT:  Head: Normocephalic.  Eyes: Pupils are equal, round, and reactive to light.  Cardiovascular: Normal rate and normal heart sounds.   Respiratory: Effort normal and breath sounds normal.  GI: Soft. Bowel sounds are normal.  Genitourinary: Vagina normal and uterus normal.  Musculoskeletal: Normal range of motion.  Neurological: She is alert and oriented to person, place, and time. She has normal reflexes.  Skin: Skin is warm and dry.  Psychiatric: She has a normal mood and affect. Her behavior is normal. Judgment and thought content normal.    Prenatal labs: ABO, Rh: --/--/A POS (11/26 [id number]) Antibody: NEG (11/26 0834) Rubella: 4.13 (07/11 1614) RPR: Non Reactive (08/28 1200)  HBsAg: Negative (07/11 1614)  HIV: Non Reactive (08/28 1200)  GBS: Positive (10/26 0000)   Assessment/Plan: Early active labor at term Stable maternal fetal unit SVE 5/70/-1 bulging Admit  GBS prophylaxis     Wyvonnia DuskyMarie Jariya Reichow 02/29/2016, 9:39 AM

## 2016-02-29 NOTE — Progress Notes (Signed)
Pt up to walk.

## 2016-02-29 NOTE — Anesthesia Procedure Notes (Signed)
Epidural Patient location during procedure: OB  Staffing Anesthesiologist: TURK, STEPHEN EDWARD Performed: anesthesiologist   Preanesthetic Checklist Completed: patient identified, pre-op evaluation, timeout performed, IV checked, risks and benefits discussed and monitors and equipment checked  Epidural Patient position: sitting Prep: DuraPrep Patient monitoring: blood pressure and continuous pulse ox Approach: midline Location: L3-L4 Injection technique: LOR air  Needle:  Needle type: Tuohy  Needle gauge: 17 G Needle length: 9 cm Needle insertion depth: 6 cm Catheter size: 19 Gauge Catheter at skin depth: 11 cm Test dose: negative and Other (1% Lidocaine)  Additional Notes Patient identified.  Risk benefits discussed including failed block, incomplete pain control, headache, nerve damage, paralysis, blood pressure changes, nausea, vomiting, reactions to medication both toxic or allergic, and postpartum back pain.  Patient expressed understanding and wished to proceed.  All questions were answered.  Sterile technique used throughout procedure and epidural site dressed with sterile barrier dressing. No paresthesia or other complications noted. The patient did not experience any signs of intravascular injection such as tinnitus or metallic taste in mouth nor signs of intrathecal spread such as rapid motor block. Please see nursing notes for vital signs. Reason for block:procedure for pain     

## 2016-02-29 NOTE — MAU Note (Signed)
Contractions since 0400. Saw some mucousy d/c when I went to [facility] and had a streak of blood in it. Denies LOF.

## 2016-03-01 ENCOUNTER — Inpatient Hospital Stay (HOSPITAL_COMMUNITY): Admission: RE | Admit: 2016-03-01 | Payer: Medicaid Other | Source: Ambulatory Visit

## 2016-03-01 NOTE — Anesthesia Postprocedure Evaluation (Signed)
Anesthesia Post Note  Patient: Hannah AlexanderSherri Norvell J Granda  Procedure(s) Performed: * No procedures listed *  Patient location during evaluation: Mother Baby Anesthesia Type: Epidural Level of consciousness: awake Pain management: pain level controlled Vital Signs Assessment: post-procedure vital signs reviewed and stable Respiratory status: spontaneous breathing Cardiovascular status: stable Postop Assessment: no headache, no backache, epidural receding and patient able to bend at knees Anesthetic complications: no     Last Vitals:  Vitals:   03/01/16 0145 03/01/16 0632  BP: (!) 103/55 117/69  Pulse: 66 85  Resp: 18 20  Temp: 36.6 C 36.6 C    Last Pain:  Vitals:   03/01/16 0639  TempSrc:   PainSc: 9    Pain Goal: Patients Stated Pain Goal: 0 (02/29/16 0909)               Edison PaceWILKERSON,Akaiya Touchette

## 2016-03-01 NOTE — Progress Notes (Signed)
UR chart review completed.  

## 2016-03-01 NOTE — Progress Notes (Signed)
Post Partum Day 1 Subjective: no complaints, up ad lib, voiding, tolerating PO and + flatus  Objective: Blood pressure 117/69, pulse 85, temperature 97.9 F (36.6 C), temperature source Oral, resp. rate 20, height 5\' 8"  (1.727 m), weight 218 lb 3.2 oz (99 kg), last menstrual period 05/20/2015, SpO2 100 %, unknown if currently breastfeeding.  Physical Exam:  General: alert, cooperative, appears stated age and no distress Lochia: appropriate Uterine Fundus: firm Incision: n/a DVT Evaluation: No evidence of DVT seen on physical exam.   Recent Labs  02/29/16 0834  HGB 9.9*  HCT 29.3*    Assessment/Plan: Plan for discharge tomorrow   LOS: 1 day   Hannah Stone 03/01/2016, 7:00 AM

## 2016-03-02 MED ORDER — PNEUMOCOCCAL VAC POLYVALENT 25 MCG/0.5ML IJ INJ
0.5000 mL | INJECTION | INTRAMUSCULAR | Status: AC
  Administered 2016-03-02: 0.5 mL via INTRAMUSCULAR
  Filled 2016-03-02 (×3): qty 0.5

## 2016-03-02 MED ORDER — FUSION PLUS PO CAPS: 1.0000 | ORAL_CAPSULE | Freq: Every day | ORAL | 2 refills | Status: DC

## 2016-03-02 MED ORDER — OXYCODONE HCL 10 MG PO TABS: 10.0000 mg | ORAL_TABLET | ORAL | 0 refills | Status: DC | PRN

## 2016-03-02 MED ORDER — IBUPROFEN 600 MG PO TABS: 600.0000 mg | ORAL_TABLET | Freq: Four times a day (QID) | ORAL | 2 refills | Status: DC

## 2016-03-02 NOTE — Progress Notes (Addendum)
Post Partum Day #2 Subjective: no complaints, up ad lib, voiding, tolerating PO and reports passing fist sized clot this AM, reports history of clots with periods.  Discussed what to do if this occurs again.  patient verbalized understanding.  Objective: Blood pressure 126/79, pulse 73, temperature 97.9 F (36.6 C), resp. rate 18, height 5\' 8"  (1.727 m), weight 218 lb 3.2 oz (99 kg), last menstrual period 05/20/2015, SpO2 100 %, unknown if currently breastfeeding.  Physical Exam:  General: alert, cooperative and no distress Lochia: appropriate Uterine Fundus: firm Incision: no significant drainage, no dehiscence, no significant erythema DVT Evaluation: No evidence of DVT seen on physical exam. No cords or calf tenderness. No significant calf/ankle edema.   Recent Labs  02/29/16 0834  HGB 9.9*  HCT 29.3*    Assessment/Plan: Discharge home and Contraception IUD planned postpartum.  Anemia: iron started, asymptomatic.     LOS: 2 days   Roe CoombsRachelle A Damean Poffenberger, CNM 03/02/2016, 7:32 AM

## 2016-03-02 NOTE — Clinical Social Work Maternal (Signed)
CLINICAL SOCIAL WORK MATERNAL/CHILD NOTE  Patient Details  Name: Hannah Stone MRN: [id number] Date of Birth: Oct 30, 1990  Date:  03/02/2016  Clinical Social Worker Initiating Note:  Laurey Arrow Date/ Time Initiated:  03/02/16/1315     Child's Name:  Ronal Fear   Legal Guardian:  Mother   Need for Interpreter:  None   Date of Referral:  03/02/16     Reason for Referral:  Behavioral Health Issues, including SI , Grief and Loss    Referral Source:  Central Nursery   Address:  [place] [address]  Phone number:  [phone]   Household Members:  Self, Parents, Relatives   Natural Supports (not living in the home):  Spouse/significant other, Immediate Family, Extended Family   Professional Supports: Transport planner (Counseling appointment scheduled with [facility]. )   Employment: Part-time   Type of Work: Heritage manager   Education:  Chiropractor Resources:  [employer]   Other Resources:  Physicist, medical , [place] Considerations Which May Impact Care:  Per [employer] Face Sheet, MOB is Non-Denominational  Strengths:  Ability to meet basic needs , Home prepared for child    Risk Factors/Current Problems:  Mental Health Concerns , Family/Relationship Issues    Cognitive State:  Able to Concentrate , Linear Thinking , Insightful    Mood/Affect:  Sad , Tearful , Depressed    CSW Assessment: CSW met with MOB after receiving a telephone call from bedside nurse regarding MOB having an argument with FOB and being tearful.  When CSW arrived to complete an assessment, MOB was resting in bed and infant was in bassinet.  Throughout the assessment MOB responded appropriately to infant's cues and demonstrated attachment and bonding. CSW explained CSW role and MOB was inviting and was receptive to meeting with CSW. MOB became tearful after CSW inquired about MOB's thoughts and feeling about being  a new mother again.  MOB reported that MOB loves her baby, but was feeling overwhelmed about MOB's relationship with FOB Ardeth Sportsman).  MOB communicated that FOB has been insensitive and argumentative with MOB.  MOB was unable to verbalize specifics or give examples of FOB's behavior; MOB denied DV.  MOB also reported that MOB is also feeling sad, because MOB's father passed in June 2017, and MOB has not had an opportunity to properly grieve. CSW had MOB to complete the Eninburgh Depression Scale and MOB scored an 46. CSW educated MOB about PPD. CSW informed the MOB of possible supports and interventions to decrease PPD.  CSW also encouraged MOB to seek medical attention if needed for increased signs and symptoms for PPD. CSW also offered MOB counseling resources and MOB was receptive to engaging in counseling.  CSW was able to obtain an appointment for counseling at [facility] on March 09, 2016 at 11:00AM. CSW provided MOB with counseling center contact information.  CSW validated MOB's thoughts and feelings and encouraged MOB to contact her OBGYN if MOB's mood decreases. CSW thanked MOB for meeting with CSW and provided MOB with CSW contact information. CSW was able to observe an increase in MOB's mood throughout the assessment as evidence by MOB smiling and laughing appropriately with CSW.  MOB denied SI and HI.  CSW informed bedside nurse of MOB's depression scale score and recommended bedside nurse to consult with OBGYN for medication management.  CSW Plan/Description:  Information/Referral to [employer] , [employer] , No Further Intervention Required/No Barriers to Discharge  Teagyn Fishel D BOYD-GILYARD, LCSW 2015-08-16, 1:19 PM

## 2016-03-02 NOTE — Discharge Summary (Signed)
Obstetric Discharge Summary Reason for Admission: onset of labor and postdates: [id number] Prenatal Procedures: NST and ultrasound Intrapartum Procedures: spontaneous vaginal delivery Postpartum Procedures: none Complications-Operative and Postpartum: 2nd degree perineal laceration Hemoglobin  Date Value Ref Range Status  02/29/2016 9.9 (L) 12.0 - 15.0 g/dL Final   HCT  Date Value Ref Range Status  02/29/2016 29.3 (L) 36.0 - 46.0 % Final   Hematocrit  Date Value Ref Range Status  12/01/2015 32.2 (L) 34.0 - 46.6 % Final    Physical Exam:  General: alert, cooperative and no distress Lochia: appropriate Uterine Fundus: firm Incision: no significant drainage, no dehiscence, no significant erythema DVT Evaluation: No evidence of DVT seen on physical exam. No cords or calf tenderness. No significant calf/ankle edema.  Discharge Diagnoses: Term Pregnancy-delivered and Post-date pregnancy Anemia  Discharge Information: Date: 03/02/2016 Activity: pelvic rest Diet: routine Medications: PNV, Ibuprofen, Colace, Iron and Percocet Condition: stable Instructions: refer to practice specific booklet Discharge to: home Follow-up Information    Hannah Stone, CNM Follow up in 4 week(s).   Specialty:  Certified Nurse Midwife Contact information: [address] [place] [id number] [phone]           Newborn Data: Live born female  Birth Weight: 8 lb 9.7 oz (3904 g) APGAR: 9, 9  Home with mother.  Hannah Stone, CNM 03/02/2016, 7:12 AM

## 2016-03-02 NOTE — Progress Notes (Signed)
Pt and FOB arguing. Pt tearful. Pt asked if she could have something for depression. Social work notified of pts affect. Will notify MD for prescription for antidepressant after social work meets with patient.

## 2016-03-31 ENCOUNTER — Ambulatory Visit: Payer: Medicaid Other | Admitting: Certified Nurse Midwife

## 2016-04-12 ENCOUNTER — Ambulatory Visit (INDEPENDENT_AMBULATORY_CARE_PROVIDER_SITE_OTHER): Payer: Medicaid Other | Admitting: Obstetrics & Gynecology

## 2016-04-12 DIAGNOSIS — Z30430 Encounter for insertion of intrauterine contraceptive device: Secondary | ICD-10-CM

## 2016-04-12 MED ORDER — LEVONORGESTREL 20 MCG/24HR IU IUD
INTRAUTERINE_SYSTEM | Freq: Once | INTRAUTERINE | Status: AC
  Administered 2016-04-12: 12:00:00 via INTRAUTERINE

## 2016-04-12 NOTE — Progress Notes (Signed)
Subjective:     Hannah Stone is a 26 y.o.S AA  female who presents for a postpartum visit. She is 6 weeks postpartum following a spontaneous vaginal delivery. I have fully reviewed the prenatal and intrapartum course. The delivery was at 40+ gestational weeks. Outcome: spontaneous vaginal delivery. Anesthesia: epidural. Postpartum course has been Normal. Baby's course has been Normal. Baby is feeding by bottle - Carnation Good Start Soy. Bleeding no bleeding. Bowel function is normal. Bladder function is normal. Patient is not sexually active. Contraception method is abstinence. Postpartum depression screening: negative.  The following portions of the patient's history were reviewed and updated as appropriate: allergies, current medications, past family history, past medical history, past social history, past surgical history and problem list.  Review of Systems Pertinent items are noted in HPI.   Objective:    BP 117/68   Pulse 74   Temp 98 F (36.7 C) (Oral)   Wt 243 lb (110.2 kg)   Breastfeeding? No   BMI 36.95 kg/m   General:  alert   Breasts:  inspection negative, no nipple discharge or bleeding, no masses or nodularity palpable  Lungs: clear to auscultation bilaterally  Heart:  regular rate and rhythm, S1, S2 normal, no murmur, click, rub or gallop  Abdomen: soft, non-tender; bowel sounds normal; no masses,  no organomegaly   Vulva:  normal  Vagina: normal vagina  Cervix:  anteverted  Corpus: normal size, contour, position, consistency, mobility, non-tender  Adnexa:  normal adnexa  Rectal Exam: Not performed.       UPT negative, consent signed, Time out procedure done. Cervix prepped with betadine and grasped with a single tooth tenaculum. Mirena was easily placed and the strings were cut to 3-4 cm. Uterus sounded to 9 cm. She tolerated the procedure well.   Assessment:    Normal  postpartum exam. Pap smear not done at today's visit.   Plan:    1.  Contraception: IUD 2. Rec back up method for 2 weeks 3. Follow up in: 4 weeks for a string check or as needed.

## 2016-04-12 NOTE — Addendum Note (Signed)
Addended by: Francene FindersJAMES, QUINETTA C on: 04/12/2016 11:46 AM   Modules accepted: Orders

## 2016-05-10 ENCOUNTER — Ambulatory Visit: Payer: Self-pay | Admitting: Obstetrics

## 2016-05-25 ENCOUNTER — Ambulatory Visit: Payer: Self-pay | Admitting: Obstetrics

## 2016-05-26 ENCOUNTER — Ambulatory Visit: Payer: Self-pay | Admitting: Obstetrics

## 2016-10-21 ENCOUNTER — Emergency Department (HOSPITAL_COMMUNITY)
Admission: EM | Admit: 2016-10-21 | Discharge: 2016-10-21 | Disposition: A | Discharge status: Home / Self Care | Payer: Medicaid Other | Attending: Emergency Medicine | Admitting: Emergency Medicine

## 2016-10-21 ENCOUNTER — Encounter (HOSPITAL_COMMUNITY): Payer: Self-pay | Admitting: Emergency Medicine

## 2016-10-21 DIAGNOSIS — J45909 Unspecified asthma, uncomplicated: Secondary | ICD-10-CM | POA: Diagnosis not present

## 2016-10-21 DIAGNOSIS — F439 Reaction to severe stress, unspecified: Secondary | ICD-10-CM | POA: Diagnosis not present

## 2016-10-21 DIAGNOSIS — F17210 Nicotine dependence, cigarettes, uncomplicated: Secondary | ICD-10-CM | POA: Insufficient documentation

## 2016-10-21 DIAGNOSIS — R42 Dizziness and giddiness: Secondary | ICD-10-CM | POA: Diagnosis not present

## 2016-10-21 DIAGNOSIS — R110 Nausea: Secondary | ICD-10-CM | POA: Insufficient documentation

## 2016-10-21 DIAGNOSIS — G44209 Tension-type headache, unspecified, not intractable: Secondary | ICD-10-CM | POA: Diagnosis not present

## 2016-10-21 DIAGNOSIS — R51 Headache: Secondary | ICD-10-CM | POA: Diagnosis present

## 2016-10-21 MED ORDER — SODIUM CHLORIDE 0.9 % IV BOLUS (SEPSIS)
1000.0000 mL | Freq: Once | INTRAVENOUS | Status: AC
  Administered 2016-10-21: 1000 mL via INTRAVENOUS

## 2016-10-21 MED ORDER — NAPROXEN 500 MG PO TABS: 500.0000 mg | ORAL_TABLET | Freq: Two times a day (BID) | ORAL | 0 refills | Status: DC | PRN

## 2016-10-21 MED ORDER — METOCLOPRAMIDE HCL 10 MG PO TABS: 10.0000 mg | ORAL_TABLET | Freq: Four times a day (QID) | ORAL | 0 refills | Status: DC | PRN

## 2016-10-21 MED ORDER — KETOROLAC TROMETHAMINE 30 MG/ML IJ SOLN
30.0000 mg | Freq: Once | INTRAMUSCULAR | Status: AC
  Administered 2016-10-21: 30 mg via INTRAVENOUS
  Filled 2016-10-21: qty 1

## 2016-10-21 MED ORDER — DEXAMETHASONE SODIUM PHOSPHATE 10 MG/ML IJ SOLN
10.0000 mg | Freq: Once | INTRAMUSCULAR | Status: AC
  Administered 2016-10-21: 10 mg via INTRAVENOUS
  Filled 2016-10-21: qty 1

## 2016-10-21 MED ORDER — METOCLOPRAMIDE HCL 5 MG/ML IJ SOLN
10.0000 mg | Freq: Once | INTRAMUSCULAR | Status: AC
  Administered 2016-10-21: 10 mg via INTRAVENOUS
  Filled 2016-10-21: qty 2

## 2016-10-21 MED ORDER — DIPHENHYDRAMINE HCL 50 MG/ML IJ SOLN
25.0000 mg | Freq: Once | INTRAMUSCULAR | Status: AC
  Administered 2016-10-21: 25 mg via INTRAVENOUS
  Filled 2016-10-21: qty 1

## 2016-10-21 NOTE — Discharge Instructions (Signed)
Alternate between tylenol and naprosyn as needed for pain. Use reglan as needed for headaches or nausea. Stay well hydrated. Get plenty of rest. Consider getting glasses that help with computer eye strain. Follow up with your regular doctor in 1 week for recheck of symptoms and ongoing management of your headaches. Return to the ER for changes or worsening symptoms.

## 2016-10-21 NOTE — ED Triage Notes (Signed)
Pt from home with complaints of headache on and off x 8 months. Pt states headache has been present x 3 days this time. Pt states headache began in frontal lobe that has now spread to the back of her head. Pt is ambulatory. Pt states she has occasional nausea and occasional dizziness but neither presently. Pt has unremarkable neuro exam

## 2016-10-21 NOTE — ED Provider Notes (Signed)
[facility]-EMERGENCY DEPT Provider Note   CSN: [id number] Arrival date & time: 10/21/16  1326     History   Chief Complaint Chief Complaint  Patient presents with  . Headache    HPI Hannah Stone is a 26 y.o. female with a PMHx of asthma, who presents to the ED with complaints of intermittent headaches 7 months since the birth of her son, which have been constant over the last 3 days. She states that she has been more stressed lately ever since her son was born, and has had additional stress at work. 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 She describes her headaches as 10/10 constant x3 days but previously intermittent x897months, throbbing/sharp/pressure-like frontal headaches that wrap around towards the back of her head like a headband, worse with stress and bright lights, and unrelieved with 200 mg Advil. She has not tried anything else for her pain. She reports associated intermittent nausea, however there is no current nausea. She also reports associated intermittent lightheadedness mostly when she stands up too quickly, none ongoing. LMP 7/10-18/18. She denies recent head injury, vertigo, ongoing lightheadedness, vision changes, fevers, chills, CP, SOB, abd pain, ongoing nausea, V/D/C, hematuria, dysuria, myalgias, arthralgias, numbness, tingling, focal weakness, or any other complaints at this time.    The history is provided by the patient and medical records. No language interpreter was used.  Headache   This is a recurrent problem. The current episode started more than 1 week ago. The problem occurs constantly (intermittent x7 months, constant x3 days). The problem has not changed since onset.The headache is associated with emotional stress. The pain is located in the frontal (like a band around front of head to back of head) region. The quality of the pain is described as throbbing. The pain is at a severity of  10/10. The pain is moderate. Radiates to: back of head. Associated symptoms include nausea (intermittent, none ongoing). Pertinent negatives include no fever, no shortness of breath and no vomiting. She has tried NSAIDs for the symptoms. The treatment provided no relief.    Past Medical History:  Diagnosis Date  . Asthma     Patient Active Problem List   Diagnosis Date Noted  . Threatened labor at term 02/29/2016  . Normal labor 02/29/2016  . BMI 31.0-31.9,adult 10/16/2015  . Anemia in pregnancy 10/15/2015  . Supervision of normal pregnancy, antepartum 10/14/2015  . History of preterm delivery, currently pregnant 10/14/2015  . Late prenatal care affecting pregnancy in second trimester, antepartum 10/14/2015    Past Surgical History:  Procedure Laterality Date  . NO PAST SURGERIES      OB History    Gravida Para Term Preterm AB Living   2 2 1 1  0 2   SAB TAB Ectopic Multiple Live Births   0 0 0 0 2       Home Medications    Prior to Admission medications   Medication Sig Start Date End Date Taking? Authorizing Provider  docusate sodium (STOOL SOFTENER) 100 MG capsule Take 100 mg by mouth 2 (two) times daily.    [provider]  ferrous sulfate (FERROUSUL) 325 (65 FE) MG tablet Take 1 tablet (325 mg total) by mouth 2 (two) times daily. Patient not taking: Reported on 04/12/2016 11/10/15   Constant, Peggy, MD  ibuprofen (ADVIL,MOTRIN) 600 MG tablet Take 1 tablet (600 mg total) by mouth every 6 (six) hours.  Patient not taking: Reported on 04/12/2016 03/02/16   Orvilla Cornwall A, CNM  Iron-FA-B Cmp-C-Biot-Probiotic (FUSION PLUS) CAPS Take 1 tablet by mouth daily. Patient not taking: Reported on 04/12/2016 03/02/16   Orvilla Cornwall A, CNM  oxyCODONE 10 MG TABS Take 1 tablet (10 mg total) by mouth every 4 (four) hours as needed (pain scale > 7). Patient not taking: Reported on 04/12/2016 03/02/16   Roe Coombs, CNM  Prenatal Vit-Fe Fumarate-FA (PRENATAL MULTIVITAMIN)  TABS tablet Take 1 tablet by mouth daily.     [provider]    Family History No family history on file.  Social History Social History  Substance Use Topics  . Smoking status: Current Every Day Smoker    Packs/day: 0.50  . Smokeless tobacco: Never Used  . Alcohol use No     Comment: Occas. when not preg.     Allergies   Patient has no known allergies.   Review of Systems Review of Systems  Constitutional: Negative for chills and fever.  Eyes: Negative for visual disturbance.  Respiratory: Negative for shortness of breath.   Cardiovascular: Negative for chest pain.  Gastrointestinal: Positive for nausea (intermittent, none ongoing). Negative for abdominal pain, constipation, diarrhea and vomiting.  Genitourinary: Negative for dysuria and hematuria.  Musculoskeletal: Negative for arthralgias and myalgias.  Skin: Negative for color change.  Allergic/Immunologic: Negative for immunocompromised state.  Neurological: Positive for light-headedness (intermittent with standing, none ongoing) and headaches. Negative for dizziness, weakness and numbness.  Psychiatric/Behavioral: Negative for confusion.   All other systems reviewed and are negative for acute change except as noted in the HPI.    Physical Exam Updated Vital Signs BP 113/69 (BP Location: Left Arm)   Pulse 67   Temp 98.4 F (36.9 C) (Oral)   Resp 17   Ht 5\' 7"  (1.702 m)   Wt 88.5 kg (195 lb)   LMP 10/16/2016   SpO2 100%   BMI 30.54 kg/m   Physical Exam  Constitutional: She is oriented to person, place, and time. Vital signs are normal. She appears well-developed and well-nourished.  Non-toxic appearance. No distress.  Afebrile, nontoxic, NAD, looking at cell phone prior to start of examination  HENT:  Head: Normocephalic and atraumatic.  Mouth/Throat: Oropharynx is clear and moist and mucous membranes are normal.  Eyes: Pupils are equal, round, and reactive to light. Conjunctivae and EOM are  normal. Right eye exhibits no discharge. Left eye exhibits no discharge.  PERRL, EOMI, no nystagmus, no visual field deficits   Neck: Normal range of motion. Neck supple. No spinous process tenderness and no muscular tenderness present. No neck rigidity. Normal range of motion present.  FROM intact without spinous process TTP, no bony stepoffs or deformities, no paraspinous muscle TTP or muscle spasms. No rigidity or meningeal signs. No bruising or swelling.   Cardiovascular: Normal rate, regular rhythm, normal heart sounds and intact distal pulses.  Exam reveals no gallop and no friction rub.   No murmur heard. Pulmonary/Chest: Effort normal and breath sounds normal. No respiratory distress. She has no decreased breath sounds. She has no wheezes. She has no rhonchi. She has no rales.  Abdominal: Soft. Normal appearance and bowel sounds are normal. She exhibits no distension. There is no tenderness. There is no rigidity, no rebound, no guarding, no CVA tenderness, no tenderness at McBurney's point and negative Murphy's sign.  Musculoskeletal: Normal range of motion.  MAE x4 Strength and sensation grossly intact in all extremities Distal pulses intact Gait steady  Neurological: She is alert and oriented to person, place, and time. She has normal strength. No cranial nerve deficit or sensory deficit. Coordination and gait normal. GCS eye subscore is 4. GCS verbal subscore is 5. GCS motor subscore is 6.  CN 2-12 grossly intact A&O x4 GCS 15 Sensation and strength intact Gait nonataxic including with tandem walking Coordination with finger-to-nose WNL Neg pronator drift   Skin: Skin is warm, dry and intact. No rash noted.  Psychiatric: She has a normal mood and affect.  Nursing note and vitals reviewed.    ED Treatments / Results  Labs (all labs ordered are listed, but only abnormal results are displayed) Labs Reviewed - No data to display  EKG  EKG Interpretation None        Radiology No results found.  Procedures Procedures (including critical care time)  Medications Ordered in ED Medications  metoCLOPramide (REGLAN) injection 10 mg (10 mg Intravenous Given 10/21/16 1645)    And  diphenhydrAMINE (BENADRYL) injection 25 mg (25 mg Intravenous Given 10/21/16 1642)    And  sodium chloride 0.9 % bolus 1,000 mL (0 mLs Intravenous Stopped 10/21/16 1741)    And  ketorolac (TORADOL) 30 MG/ML injection 30 mg (30 mg Intravenous Given 10/21/16 1647)  dexamethasone (DECADRON) injection 10 mg (10 mg Intravenous Given 10/21/16 1648)     Initial Impression / Assessment and Plan / ED Course  I have reviewed the triage vital signs and the nursing notes.  Pertinent labs & imaging results that were available during my care of the patient were reviewed by me and considered in my medical decision making (see chart for details).     26 y.o. female here with intermittent HA x7 months since the birth of her son, but constant x3 days. 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 On exam, no focal neuro deficits, no concerning s/sx. Doubt secondary etiology of headaches. Likely tension headaches or migraines. Will give migraine cocktail with decadron as well. Will reassess shortly  7:25 PM Pt feeling much better, ready to go home. Advised stress management, staying hydrated, perhaps getting glasses to help with eye strain from computer screen, and alternate between naprosyn and tylenol for headaches, reglan rx given as well. F/up with PCP in 1wk for recheck of symptoms. I explained the diagnosis and have given explicit precautions to return to the ER including for any other new or worsening symptoms. The patient understands and accepts the medical plan as it's been dictated and I have answered their questions. Discharge instructions concerning home care and prescriptions have  been given. The patient is STABLE and is discharged to home in good condition.    Final Clinical Impressions(s) / ED Diagnoses   Final diagnoses:  Tension headache  Stress  Nausea  Orthostatic lightheadedness    New Prescriptions New Prescriptions   METOCLOPRAMIDE (REGLAN) 10 MG TABLET    Take 1 tablet (10 mg total) by mouth every 6 (six) hours as needed for nausea (nausea/headache).   NAPROXEN (NAPROSYN) 500 MG TABLET    Take 1 tablet (500 mg total) by mouth 2 (two) times daily as needed for mild pain, moderate pain or headache (TAKE WITH MEALS.).     [address] [place], [place] 10/21/16 1925    Marily Memos, MD 10/22/16 [phone]

## 2017-02-10 ENCOUNTER — Other Ambulatory Visit: Payer: Self-pay

## 2017-02-10 ENCOUNTER — Ambulatory Visit: Payer: Medicaid Other | Admitting: Obstetrics

## 2017-02-10 ENCOUNTER — Encounter: Payer: Self-pay | Admitting: Obstetrics

## 2017-02-10 VITALS — BP 123/76 | HR 69 | Ht 68.0 in | Wt 204.9 lb

## 2017-02-10 DIAGNOSIS — N611 Abscess of the breast and nipple: Secondary | ICD-10-CM | POA: Diagnosis not present

## 2017-02-10 NOTE — Progress Notes (Signed)
Presents for left Breast lump and pain x 7 days 3/10. Denies LOF, fever. Not breastfeeding. Last PAP was 10/14/15; she is currently on her period and will schedule her AEX.

## 2017-02-10 NOTE — Progress Notes (Signed)
Patient ID: Hannah Stone, female   DOB: 04/12/1990, 26 y.o.   MRN: [id number]  Chief Complaint  Patient presents with  . Breast Problem    HPI Hannah Stone is a 26 y.o. female.  Patient felt tender lump in left breast ~ 8 days ago, now no longer there. HPI  Past Medical History:  Diagnosis Date  . Asthma     Past Surgical History:  Procedure Laterality Date  . NO PAST SURGERIES      History reviewed. No pertinent family history.  Social History Social History   Tobacco Use  . Smoking status: Current Every Day Smoker    Packs/day: 0.50  . Smokeless tobacco: Never Used  Substance Use Topics  . Alcohol use: No    Comment: Occas. when not preg.  . Drug use: No    No Known Allergies  Current Outpatient Medications  Medication Sig Dispense Refill  . levonorgestrel (MIRENA) 20 MCG/24HR IUD 1 each by Intrauterine route once.    Marland Kitchen. aspirin EC 81 MG tablet Take 81 mg by mouth once.    . ferrous sulfate (FERROUSUL) 325 (65 FE) MG tablet Take 1 tablet (325 mg total) by mouth 2 (two) times daily. (Patient not taking: Reported on 04/12/2016) 60 tablet 1  . metoCLOPramide (REGLAN) 10 MG tablet Take 1 tablet (10 mg total) by mouth every 6 (six) hours as needed for nausea (nausea/headache). (Patient not taking: Reported on 02/10/2017) 6 tablet 0  . naproxen (NAPROSYN) 500 MG tablet Take 1 tablet (500 mg total) by mouth 2 (two) times daily as needed for mild pain, moderate pain or headache (TAKE WITH MEALS.). (Patient not taking: Reported on 02/10/2017) 20 tablet 0   No current facility-administered medications for this visit.     Review of Systems Review of Systems Constitutional: negative for fatigue and weight loss Respiratory: negative for cough and wheezing Cardiovascular: negative for chest pain, fatigue and palpitations Gastrointestinal: negative for abdominal pain and change in bowel habits Genitourinary:negative Integument/breast: positive for left breast  lump, resolved Musculoskeletal:negative for myalgias Neurological: negative for gait problems and tremors Behavioral/Psych: negative for abusive relationship, depression Endocrine: negative for temperature intolerance      Blood pressure 123/76, pulse 69, height 5\' 8"  (1.727 m), weight 204 lb 14.4 oz (92.9 kg), currently breastfeeding.  Physical Exam Physical Exam General:   alert  Skin:   no rash or abnormalities  Lungs:   clear to auscultation bilaterally  Heart:   regular rate and rhythm, S1, S2 normal, no murmur, click, rub or gallop  Breasts:    Left breast          Scarring from healed carbuncle palpated, non tender, no discharge   50% of 15 min visit spent on counseling and coordination of care.    Data Reviewed None  Assessment     1. Carbuncle of breast - Left, outer quadrant at 5 o'clock - healed, resolved    Plan    Follow up for Annual  No orders of the defined types were placed in this encounter.  No orders of the defined types were placed in this encounter.

## 2017-02-21 ENCOUNTER — Other Ambulatory Visit: Payer: Self-pay

## 2017-02-21 ENCOUNTER — Encounter (HOSPITAL_COMMUNITY): Payer: Self-pay | Admitting: Emergency Medicine

## 2017-02-21 ENCOUNTER — Emergency Department (HOSPITAL_COMMUNITY)
Admission: EM | Admit: 2017-02-21 | Discharge: 2017-02-21 | Disposition: A | Discharge status: Home / Self Care | Payer: Medicaid Other | Attending: Emergency Medicine | Admitting: Emergency Medicine

## 2017-02-21 DIAGNOSIS — Z5321 Procedure and treatment not carried out due to patient leaving prior to being seen by health care provider: Secondary | ICD-10-CM | POA: Insufficient documentation

## 2017-02-21 DIAGNOSIS — M549 Dorsalgia, unspecified: Secondary | ICD-10-CM | POA: Diagnosis not present

## 2017-02-21 NOTE — ED Triage Notes (Signed)
Pt states her lower back hurts and feels like she "pulled something" while working. The pain began last night. Denies any known injury. Pt is ambulatory.

## 2017-02-21 NOTE — ED Notes (Signed)
Pt's name was called several times to be roomed and no reply at 19:38.

## 2017-02-23 ENCOUNTER — Ambulatory Visit: Payer: Medicaid Other | Admitting: Obstetrics

## 2017-07-03 IMAGING — US US MFM OB COMP +14 WKS
1 series · 14 of 28 positions shown · non-contrast
Comparison: none

[Series 1: us mfm ob comp +14 wks · 69 acquisitions, 14 frames shown]
[im 3/69]
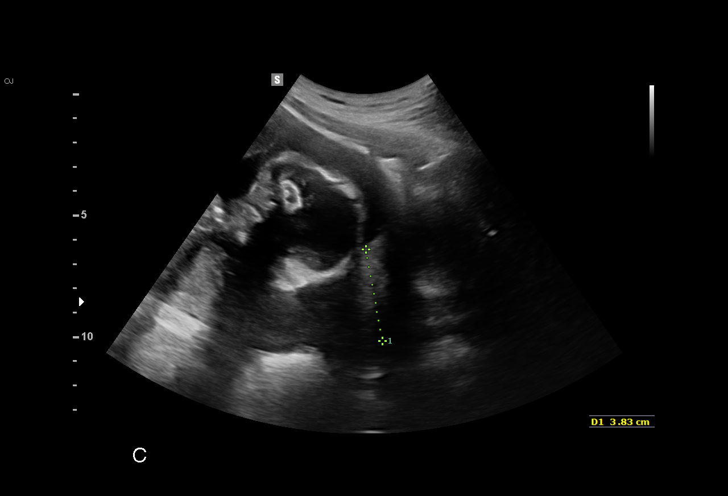
[im 8/69]
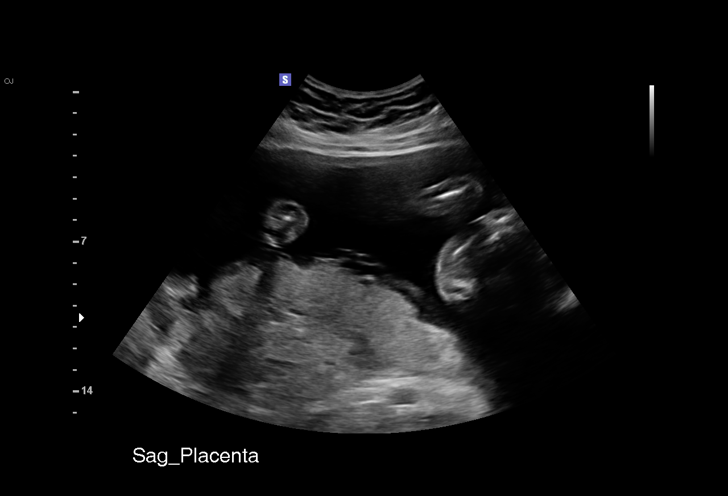
[im 13/69]
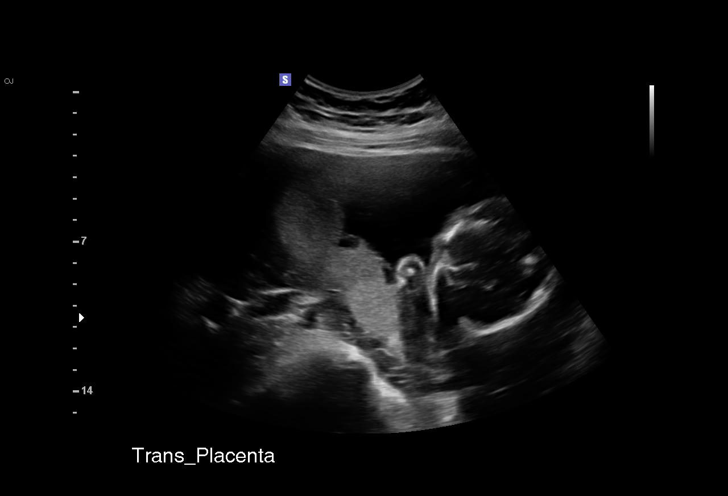
[im 18/69]
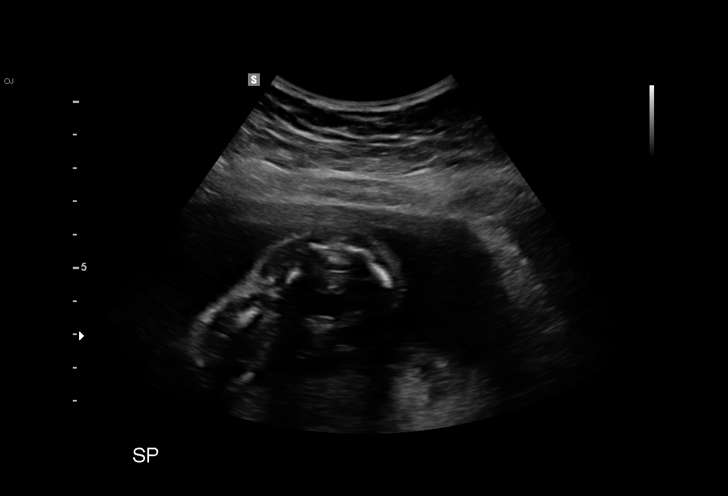
[im 23/69]
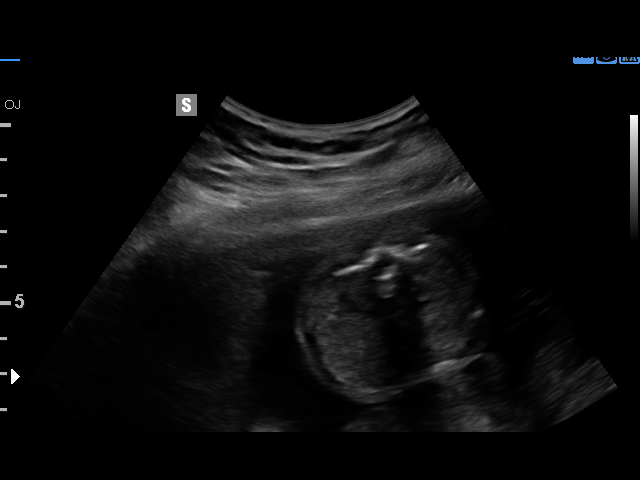
[im 28/69]
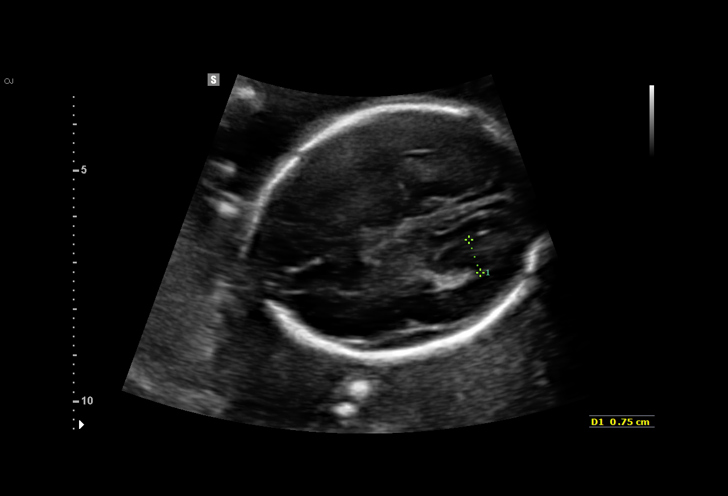
[im 33/69]
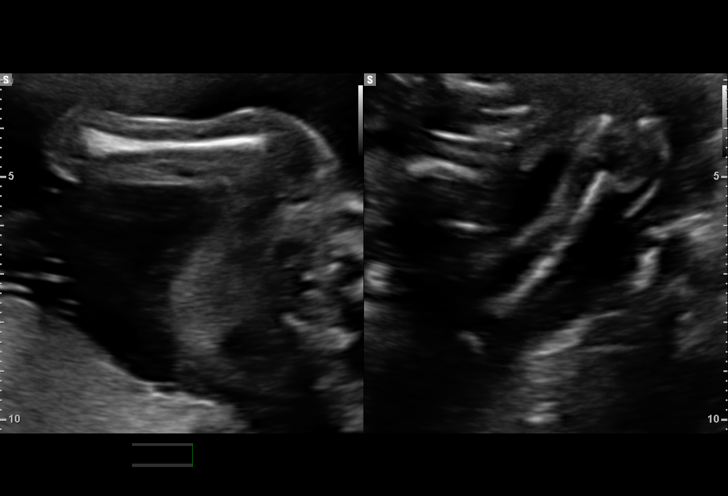
[im 38/69]
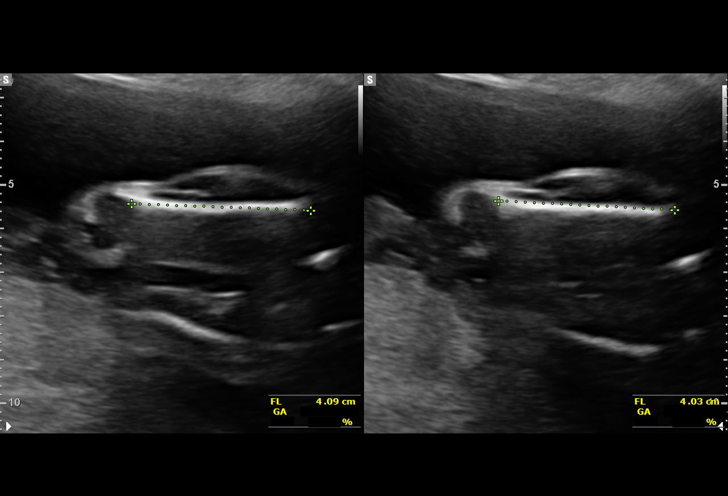
[im 43/69]
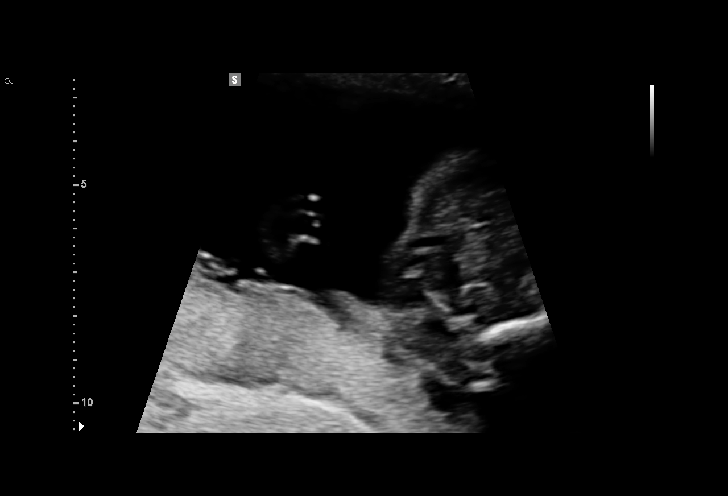
[im 48/69]
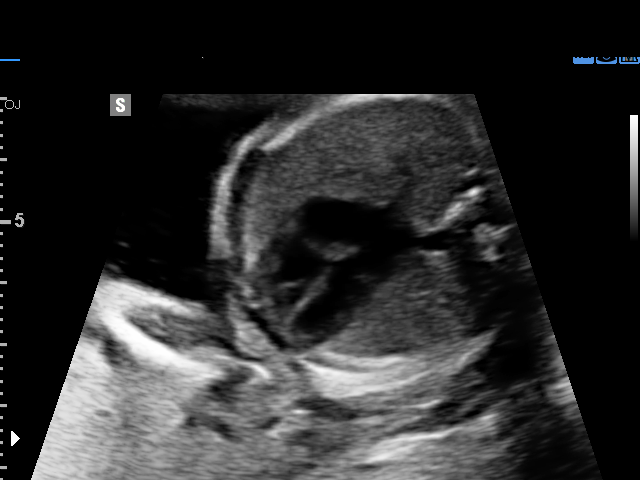
[im 53/69]
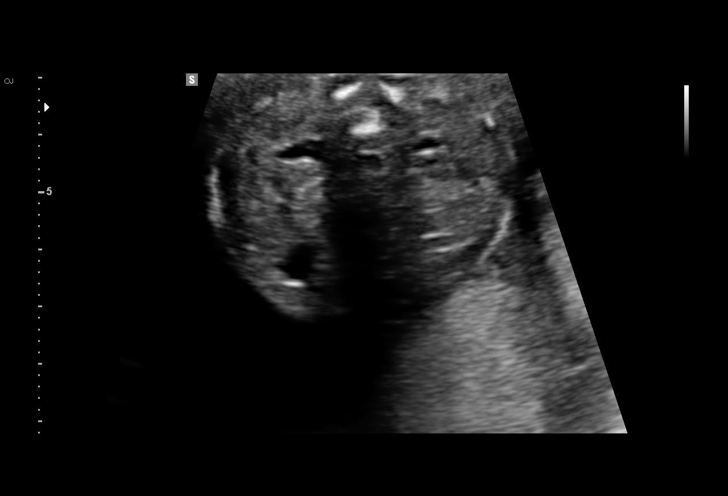
[im 58/69]
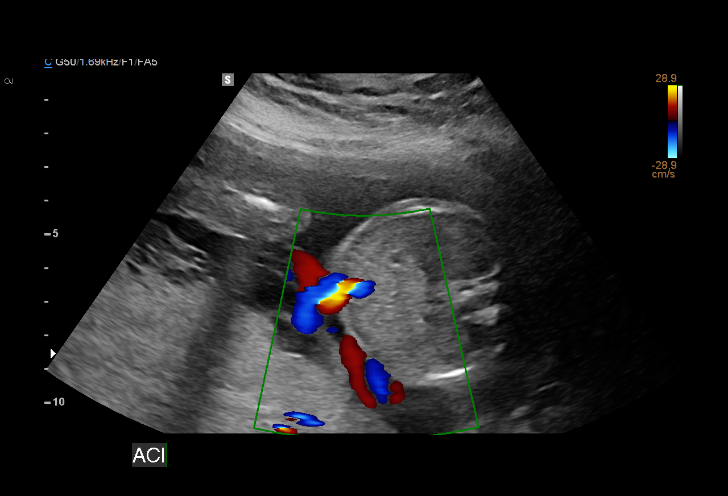
[im 63/69]
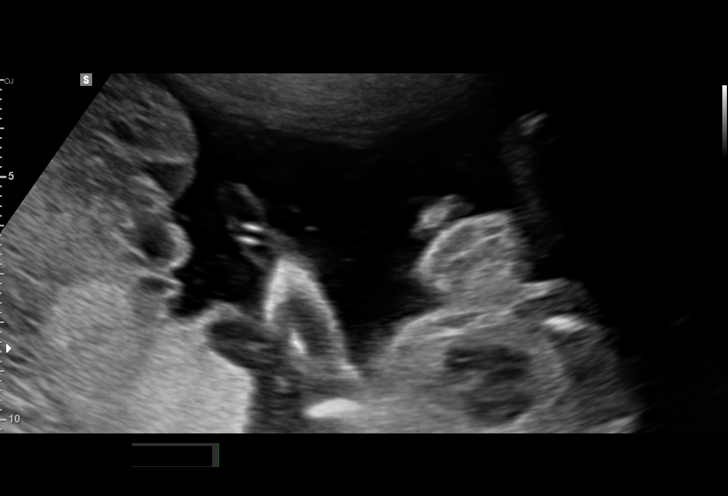
[im 69/69]
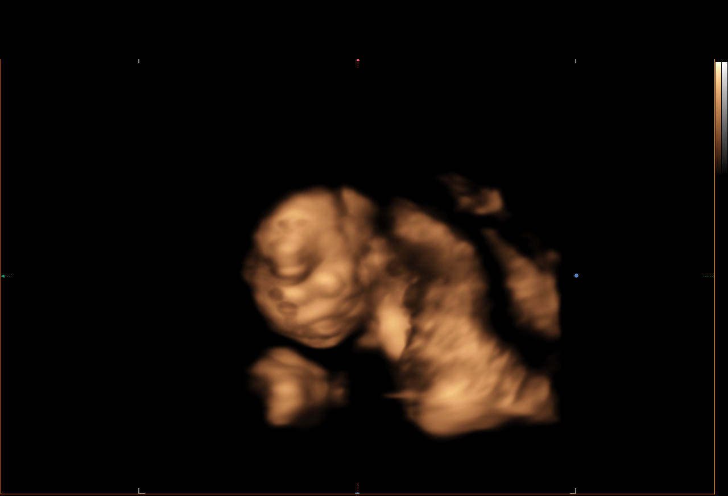

[14 of 28 positions shown; findings below may reference images not displayed]

Hospital Clinic-
Faculty Physician
OB/Gyn Clinic

1  CRIS EVERT ADHAM          [id number]       [id number]     [id number]
Indications

22 weeks gestation of pregnancy
Late prenatal care, second trimester
Poor obstetric history: Previous preterm
delivery, antepartum (36wk PPROM)
Asthma                                         GKK.DK [id number]
OB History

Gravidity:    2         Term:   0        Prem:   1        SAB:   0
TOP:          0       Ectopic:  0        Living: 1
Fetal Evaluation

Num Of Fetuses:     1
Fetal Heart         143
Rate(bpm):
Cardiac Activity:   Observed
Presentation:       Cephalic
Placenta:           Posterior, above cervical os
P. Cord Insertion:  Visualized

Amniotic Fluid
AFI FV:      Subjectively within normal limits

Largest Pocket(cm)
4.6
Biometry
BPD:      53.7  mm     G. Age:  22w 2d         60  %    CI:        75.69   %    70 - 86
FL/HC:      20.8   %    18.4 -
HC:      195.7  mm     G. Age:  21w 5d         29  %    HC/AC:      1.09        1.06 -
AC:      179.4  mm     G. Age:  22w 6d         68  %    FL/BPD:     75.8   %    71 - 87
FL:       40.7  mm     G. Age:  23w 1d         78  %    FL/AC:      22.7   %    20 - 24
HUM:      39.2  mm     G. Age:  24w 0d       > 95  %
CER:      25.1  mm     G. Age:  23w 1d         67  %
CM:        6.1  mm

Est. FW:     538  gm      1 lb 3 oz     60  %
Gestational Age

LMP:           22w 0d        Date:  05/20/15                 EDD:   02/24/16
U/S Today:     22w 4d                                        EDD:   02/20/16
Best:          22w 0d     Det. By:  LMP  (05/20/15)          EDD:   02/24/16
Anatomy

Cranium:               Appears normal         LVOT:                   Not well visualized
Cavum:                 Appears normal         Aortic Arch:            Appears normal
Ventricles:            Appears normal         Diaphragm:              Appears normal
Choroid Plexus:        Appears normal         Stomach:                Appears normal, left
sided
Cerebellum:            Appears normal         Abdomen:                Appears normal
Posterior Fossa:       Appears normal         Abdominal Wall:         Appears nml (cord
insert, abd wall)
Nuchal Fold:           Not applicable (>20    Cord Vessels:           Appears normal (3
wks GA)                                        vessel cord)
Face:                  Appears normal         Kidneys:                Appear normal
(orbits and profile)
Lips:                  Appears normal         Bladder:                Appears normal
Thoracic:              Appears normal         Spine:                  Appears normal
Heart:                 Not well visualized    Upper Extremities:      Appears normal
RVOT:                  Not well visualized    Lower Extremities:      Appears normal

Other:  Fetus appears to be a male. Technically difficult due to fetal position.
Cervix Uterus Adnexa

Cervix
Length:            3.8  cm.
Normal appearance by transabdominal scan.

Uterus
No abnormality visualized.

Left Ovary
Not visualized. No adnexal mass visualized.

Right Ovary
Within normal limits.
Impression

Singleton intrauterine pregnancy at 22 weeks 0 days
gestation with fetal cardiac activity
Cephalic presentation
Posterior placenta without evidence of previa
Normal appearing fetal growth and amniotic fluid volume
No apparent birth defects noted although not all structures
well visualized on fetal anatomic survey today secondary to
fetal position
Normal appearing cervical length
Recommendations

Recommend follow-up ultrasound examination in 
3-4 weeks

## 2017-07-30 ENCOUNTER — Ambulatory Visit (HOSPITAL_COMMUNITY)
Admission: EM | Admit: 2017-07-30 | Discharge: 2017-07-30 | Disposition: A | Discharge status: Home / Self Care | Payer: Medicaid Other | Attending: Family Medicine | Admitting: Family Medicine

## 2017-07-30 ENCOUNTER — Encounter (HOSPITAL_COMMUNITY): Payer: Self-pay | Admitting: Emergency Medicine

## 2017-07-30 DIAGNOSIS — J4521 Mild intermittent asthma with (acute) exacerbation: Secondary | ICD-10-CM | POA: Diagnosis not present

## 2017-07-30 MED ORDER — ALBUTEROL SULFATE (2.5 MG/3ML) 0.083% IN NEBU
2.5000 mg | INHALATION_SOLUTION | Freq: Once | RESPIRATORY_TRACT | Status: AC
  Administered 2017-07-30: 2.5 mg via RESPIRATORY_TRACT

## 2017-07-30 MED ORDER — ALBUTEROL SULFATE (2.5 MG/3ML) 0.083% IN NEBU
INHALATION_SOLUTION | RESPIRATORY_TRACT | Status: AC
  Filled 2017-07-30: qty 3

## 2017-07-30 MED ORDER — SPACER/AERO CHAMBER MOUTHPIECE MISC: 1.0000 | Freq: Four times a day (QID) | 0 refills | Status: DC | PRN

## 2017-07-30 MED ORDER — ALBUTEROL SULFATE HFA 108 (90 BASE) MCG/ACT IN AERS: 2.0000 | INHALATION_SPRAY | RESPIRATORY_TRACT | 1 refills | Status: DC | PRN

## 2017-07-30 MED ORDER — PREDNISONE 20 MG PO TABS: ORAL_TABLET | ORAL | 0 refills | Status: DC

## 2017-07-30 NOTE — ED Triage Notes (Signed)
Pt c/o cough since yesterday, difficutly taking a deep breath. Pt states she had asthma as a kid.

## 2017-07-30 NOTE — ED Provider Notes (Signed)
[facility]   [id number] 07/30/17 Arrival Time: 1442   SUBJECTIVE:  Hannah Stone is a 27 y.o. female who presents to the urgent care with complaint of cough since yesterday, difficutly taking a deep breath. Pt states she had asthma as a kid.      Past Medical History:  Diagnosis Date  . Asthma    No family history on file. Social History   Socioeconomic History  . Marital status: Single    Spouse name: Not on file  . Number of children: Not on file  . Years of education: Not on file  . Highest education level: Not on file  Occupational History  . Not on file  Social Needs  . Financial resource strain: Not on file  . Food insecurity:    Worry: Not on file    Inability: Not on file  . Transportation needs:    Medical: Not on file    Non-medical: Not on file  Tobacco Use  . Smoking status: Current Every Day Smoker    Packs/day: 0.50    Types: Cigarettes  . Smokeless tobacco: Never Used  Substance and Sexual Activity  . Alcohol use: Yes    Comment: Occas. when not preg.  . Drug use: No  . Sexual activity: Yes    Birth control/protection: None    Comment: 1 week ago  Lifestyle  . Physical activity:    Days per week: Not on file    Minutes per session: Not on file  . Stress: Not on file  Relationships  . Social connections:    Talks on phone: Not on file    Gets together: Not on file    Attends religious service: Not on file    Active member of club or organization: Not on file    Attends meetings of clubs or organizations: Not on file    Relationship status: Not on file  . Intimate partner violence:    Fear of current or ex partner: Not on file    Emotionally abused: Not on file    Physically abused: Not on file    Forced sexual activity: Not on file  Other Topics Concern  . Not on file  Social History Narrative  . Not on file   No outpatient medications have been marked as taking for the 07/30/17 encounter [facility] Encounter).     No Known Allergies    ROS: As per HPI, remainder of ROS negative.   OBJECTIVE:   Vitals:   07/30/17 1450  BP: 111/84  Pulse: 87  Resp: 18  Temp: 99.1 F (37.3 C)  SpO2: 100%     General appearance: alert; no distress Eyes: PERRL; EOMI; conjunctiva normal HENT: normocephalic; atraumatic; TMs normal, canal normal, external ears normal without trauma; nasal mucosa normal; oral mucosa normal Neck: supple Lungs: expiratory wheezes on auscultation bilaterally Heart: regular rate and rhythm Back: no CVA tenderness Extremities: no cyanosis or edema; symmetrical with no gross deformities Skin: warm and dry Neurologic: normal gait; grossly normal Psychological: alert and cooperative; normal mood and affect      Labs:  Results for orders placed or performed during the hospital encounter of 02/29/16  OB RESULT CONSOLE Group B Strep  Result Value Ref Range   GBS Positive   CBC  Result Value Ref Range   WBC 13.9 (H) 4.0 - 10.5 K/uL   RBC 3.42 (L) 3.87 - 5.11 MIL/uL   Hemoglobin 9.9 (L) 12.0 - 15.0 g/dL  HCT 29.3 (L) 36.0 - 46.0 %   MCV 85.7 78.0 - 100.0 fL   MCH 28.9 26.0 - 34.0 pg   MCHC 33.8 30.0 - 36.0 g/dL   RDW 16.1 (H) 09.6 - 04.5 %   Platelets 289 150 - 400 K/uL  RPR  Result Value Ref Range   RPR Ser Ql Non Reactive Non Reactive  Type and screen [facility] [place]  Result Value Ref Range   ABO/RH(D) A POS    Antibody Screen NEG    Sample Expiration 03/03/2016   ABO/Rh  Result Value Ref Range   ABO/RH(D) A POS     Labs Reviewed - No data to display  No results found.     ASSESSMENT & PLAN:  1. Mild intermittent asthma with exacerbation     Meds ordered this encounter  Medications  . albuterol (PROVENTIL) (2.5 MG/3ML) 0.083% nebulizer solution 2.5 mg  . predniSONE (DELTASONE) 20 MG tablet    Sig: Two daily with food    Dispense:  10 tablet    Refill:  0  . albuterol (PROVENTIL HFA;VENTOLIN HFA) 108 (90 Base) MCG/ACT inhaler     Sig: Inhale 2 puffs into the lungs every 4 (four) hours as needed for wheezing or shortness of breath (cough, shortness of breath or wheezing.).    Dispense:  1 Inhaler    Refill:  1  . Spacer/Aero Chamber Mouthpiece MISC    Sig: 1 Device by Does not apply route 4 (four) times daily as needed.    Dispense:  1 each    Refill:  0    Reviewed expectations re: course of current medical issues. Questions answered. Outlined signs and symptoms indicating need for more acute intervention. Patient verbalized understanding. After Visit Summary given.    Procedures:      Elvina Sidle, MD 07/30/17 [phone]

## 2017-09-13 ENCOUNTER — Ambulatory Visit: Payer: Self-pay | Admitting: Obstetrics

## 2017-12-01 ENCOUNTER — Ambulatory Visit: Payer: Self-pay | Admitting: Obstetrics

## 2017-12-16 ENCOUNTER — Ambulatory Visit: Payer: Medicaid Other | Admitting: Obstetrics

## 2017-12-16 ENCOUNTER — Other Ambulatory Visit (HOSPITAL_COMMUNITY)
Admission: RE | Admit: 2017-12-16 | Discharge: 2017-12-16 | Disposition: A | Discharge status: Home / Self Care | Payer: Medicaid Other | Source: Ambulatory Visit | Attending: Obstetrics | Admitting: Obstetrics

## 2017-12-16 ENCOUNTER — Encounter: Payer: Self-pay | Admitting: Obstetrics

## 2017-12-16 VITALS — BP 113/71 | HR 82 | Ht 67.0 in | Wt 206.2 lb

## 2017-12-16 DIAGNOSIS — M545 Low back pain, unspecified: Secondary | ICD-10-CM

## 2017-12-16 DIAGNOSIS — Z0000 Encounter for general adult medical examination without abnormal findings: Secondary | ICD-10-CM

## 2017-12-16 DIAGNOSIS — Z975 Presence of (intrauterine) contraceptive device: Secondary | ICD-10-CM | POA: Diagnosis not present

## 2017-12-16 DIAGNOSIS — R87612 Low grade squamous intraepithelial lesion on cytologic smear of cervix (LGSIL): Secondary | ICD-10-CM | POA: Diagnosis not present

## 2017-12-16 DIAGNOSIS — Z01411 Encounter for gynecological examination (general) (routine) with abnormal findings: Secondary | ICD-10-CM | POA: Insufficient documentation

## 2017-12-16 DIAGNOSIS — G8929 Other chronic pain: Secondary | ICD-10-CM

## 2017-12-16 DIAGNOSIS — Z30431 Encounter for routine checking of intrauterine contraceptive device: Secondary | ICD-10-CM

## 2017-12-16 DIAGNOSIS — Z01419 Encounter for gynecological examination (general) (routine) without abnormal findings: Secondary | ICD-10-CM | POA: Diagnosis not present

## 2017-12-16 DIAGNOSIS — N898 Other specified noninflammatory disorders of vagina: Secondary | ICD-10-CM

## 2017-12-16 DIAGNOSIS — L02429 Furuncle of limb, unspecified: Secondary | ICD-10-CM

## 2017-12-16 DIAGNOSIS — Z113 Encounter for screening for infections with a predominantly sexual mode of transmission: Secondary | ICD-10-CM | POA: Diagnosis not present

## 2017-12-16 DIAGNOSIS — Z1501 Genetic susceptibility to malignant neoplasm of breast: Secondary | ICD-10-CM

## 2017-12-16 MED ORDER — IBUPROFEN 800 MG PO TABS: 800.0000 mg | ORAL_TABLET | Freq: Three times a day (TID) | ORAL | 5 refills | Status: DC | PRN

## 2017-12-16 MED ORDER — CYCLOBENZAPRINE HCL 10 MG PO TABS: 10.0000 mg | ORAL_TABLET | Freq: Three times a day (TID) | ORAL | 1 refills | Status: DC | PRN

## 2017-12-16 MED ORDER — CLINDAMYCIN HCL 300 MG PO CAPS: 300.0000 mg | ORAL_CAPSULE | Freq: Three times a day (TID) | ORAL | 2 refills | Status: DC
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
## 2017-12-16 NOTE — Progress Notes (Signed)
Patient is in the office for annual, last pap 10-14-15. Pt has Mirena IUD, pt complains that 3 months ago, cycles have started lasting 10 days and are now heavier. Pt desires std testing.

## 2017-12-17 ENCOUNTER — Other Ambulatory Visit: Payer: Self-pay | Admitting: Obstetrics

## 2017-12-17 DIAGNOSIS — Z0000 Encounter for general adult medical examination without abnormal findings: Secondary | ICD-10-CM

## 2017-12-17 LAB — RPR: RPR Ser Ql: NONREACTIVE

## 2017-12-17 LAB — HIV ANTIBODY (ROUTINE TESTING W REFLEX): HIV Screen 4th Generation wRfx: NONREACTIVE

## 2017-12-17 LAB — HEPATITIS B SURFACE ANTIGEN: Hepatitis B Surface Ag: NEGATIVE

## 2017-12-17 LAB — HEPATITIS C ANTIBODY

## 2017-12-19 ENCOUNTER — Other Ambulatory Visit: Payer: Self-pay | Admitting: Obstetrics

## 2017-12-19 ENCOUNTER — Telehealth: Payer: Self-pay

## 2017-12-19 LAB — CERVICOVAGINAL ANCILLARY ONLY
Bacterial vaginitis: POSITIVE — AB
CANDIDA VAGINITIS: NEGATIVE
Chlamydia: NEGATIVE
Neisseria Gonorrhea: NEGATIVE
Trichomonas: NEGATIVE

## 2017-12-19 NOTE — Telephone Encounter (Signed)
Return call to pt regarding message / concerns pt not ava VM full not able to leave a message.

## 2017-12-20 LAB — CYTOLOGY - PAP

## 2017-12-21 ENCOUNTER — Other Ambulatory Visit: Payer: Self-pay | Admitting: Obstetrics

## 2017-12-22 ENCOUNTER — Telehealth: Payer: Self-pay

## 2017-12-22 NOTE — Telephone Encounter (Signed)
Clindamycin Rx for BV 

## 2017-12-22 NOTE — Telephone Encounter (Signed)
Pt requesting Rx for recent BV result.

## 2018-01-03 ENCOUNTER — Telehealth: Payer: Self-pay | Admitting: *Deleted

## 2018-01-03 NOTE — Telephone Encounter (Signed)
Pt called to office regarding Rx.  Attempt to return call.  No answer, LM on VM.

## 2018-01-05 ENCOUNTER — Encounter: Payer: Self-pay | Admitting: Obstetrics

## 2018-01-12 ENCOUNTER — Encounter: Payer: Self-pay | Admitting: Obstetrics

## 2018-01-12 ENCOUNTER — Ambulatory Visit (INDEPENDENT_AMBULATORY_CARE_PROVIDER_SITE_OTHER): Payer: Medicaid Other | Admitting: Obstetrics

## 2018-01-12 ENCOUNTER — Other Ambulatory Visit (HOSPITAL_COMMUNITY)
Admission: RE | Admit: 2018-01-12 | Discharge: 2018-01-12 | Disposition: A | Discharge status: Home / Self Care | Payer: Medicaid Other | Source: Ambulatory Visit | Attending: Obstetrics | Admitting: Obstetrics

## 2018-01-12 VITALS — BP 116/70 | HR 87 | Ht 68.0 in | Wt 213.1 lb

## 2018-01-12 DIAGNOSIS — N870 Mild cervical dysplasia: Secondary | ICD-10-CM

## 2018-01-12 NOTE — Progress Notes (Signed)
Colposcopy Procedure Note  Indications: Pap smear 1 months ago showed: low-grade squamous intraepithelial neoplasia (LGSIL - encompassing HPV,mild dysplasia,CIN I). The prior pap showed no abnormalities.  Prior cervical/vaginal disease: normal exam without visible pathology. Prior cervical treatment: no treatment.  Procedure Details  The risks and benefits of the procedure and Written informed consent obtained.  A time-out was performed confirming the patient, procedure and allergy status  Speculum placed in vagina and excellent visualization of cervix achieved, cervix swabbed x 3 with acetic acid solution.  Findings: Cervix: no visible lesions, no mosaicism, no punctation and no abnormal vasculature; SCJ visualized 360 degrees without lesions, endocervical curettage performed, cervical biopsies taken at 5 and 1 o'clock, specimen labelled and sent to pathology and hemostasis achieved with silver nitrate.   Vaginal inspection: normal without visible lesions. Vulvar colposcopy: vulvar colposcopy not performed.   Physical Exam   Specimens: ECC and Cervical Biopsies  Complications: none.  Plan: Specimens labelled and sent to Pathology. Will base further treatment on Pathology findings. Treatment options discussed with patient. Return to discuss Pathology results in 2 weeks    Brock Bad MD 01-12-2018.

## 2018-01-12 NOTE — Progress Notes (Signed)
Pt presents for a colposcopy.

## 2018-01-12 NOTE — Addendum Note (Signed)
Addended by: Hamilton Capri on: 01/12/2018 04:51 PM   Modules accepted: Orders

## 2018-01-12 NOTE — Addendum Note (Signed)
Addended by: Coral Ceo A on: 01/12/2018 04:53 PM   Modules accepted: Orders

## 2018-01-26 ENCOUNTER — Ambulatory Visit: Payer: Medicaid Other | Admitting: Obstetrics

## 2019-01-08 ENCOUNTER — Other Ambulatory Visit: Payer: Self-pay

## 2019-01-08 DIAGNOSIS — Z20828 Contact with and (suspected) exposure to other viral communicable diseases: Secondary | ICD-10-CM | POA: Diagnosis not present

## 2019-01-08 DIAGNOSIS — Z20822 Contact with and (suspected) exposure to covid-19: Secondary | ICD-10-CM

## 2019-01-10 LAB — NOVEL CORONAVIRUS, NAA: SARS-CoV-2, NAA: NOT DETECTED

## 2019-08-08 ENCOUNTER — Ambulatory Visit: Payer: Medicaid Other | Admitting: Obstetrics

## 2019-08-08 ENCOUNTER — Other Ambulatory Visit: Payer: Self-pay

## 2019-08-08 ENCOUNTER — Other Ambulatory Visit (HOSPITAL_COMMUNITY)
Admission: RE | Admit: 2019-08-08 | Discharge: 2019-08-08 | Disposition: A | Discharge status: Home / Self Care | Payer: Medicaid Other | Source: Ambulatory Visit | Attending: Obstetrics | Admitting: Obstetrics

## 2019-08-08 ENCOUNTER — Encounter: Payer: Self-pay | Admitting: Obstetrics

## 2019-08-08 VITALS — BP 125/83 | HR 75 | Wt 215.0 lb

## 2019-08-08 DIAGNOSIS — N898 Other specified noninflammatory disorders of vagina: Secondary | ICD-10-CM | POA: Insufficient documentation

## 2019-08-08 DIAGNOSIS — Z01419 Encounter for gynecological examination (general) (routine) without abnormal findings: Secondary | ICD-10-CM | POA: Diagnosis not present

## 2019-08-08 DIAGNOSIS — Z0000 Encounter for general adult medical examination without abnormal findings: Secondary | ICD-10-CM

## 2019-08-08 DIAGNOSIS — Z113 Encounter for screening for infections with a predominantly sexual mode of transmission: Secondary | ICD-10-CM

## 2019-08-08 DIAGNOSIS — Z30431 Encounter for routine checking of intrauterine contraceptive device: Secondary | ICD-10-CM

## 2019-08-08 NOTE — Progress Notes (Addendum)
Subjective:        Hannah Stone is a 29 y.o. female here for a routine exam.  Current complaints: None.    Personal health questionnaire:  Is patient Ashkenazi Jewish, have a family history of breast and/or ovarian cancer: yes Is there a family history of uterine cancer diagnosed at age < 75, gastrointestinal cancer, urinary tract cancer, family member who is a Personnel officer syndrome-associated carrier: no Is the patient overweight and hypertensive, family history of diabetes, personal history of gestational diabetes, preeclampsia or PCOS: no Is patient over 88, have PCOS,  family history of premature CHD under age 56, diabetes, smoke, have hypertension or peripheral artery disease:  no At any time, has a partner hit, kicked or otherwise hurt or frightened you?: no Over the past 2 weeks, have you felt down, depressed or hopeless?: no Over the past 2 weeks, have you felt little interest or pleasure in doing things?:no   Gynecologic History No LMP recorded. (Menstrual status: IUD). Contraception: IUD Last Pap: 2019. Results were: LGSIL.  Colposcopic biopsies and ECC:  LGSIL Last mammogram: n/a. Results were: n/a  Obstetric History OB History  Gravida Para Term Preterm AB Living  2 2 1 1  0 2  SAB TAB Ectopic Multiple Live Births  0 0 0 0 2    # Outcome Date GA Lbr Len/2nd Weight Sex Delivery Anes PTL Lv  2 Term 02/29/16 [redacted]w[redacted]d 13:53 / 00:17 8 lb 9.7 oz (3.904 kg) M Vag-Spont EPI  LIV  1 Preterm 07/08/11 [redacted]w[redacted]d 36:12 / 00:44 5 lb 12.4 oz (2.62 kg) F Vag-Spont EPI  LIV     Birth Comments: WNL    Past Medical History:  Diagnosis Date  . Asthma     Past Surgical History:  Procedure Laterality Date  . NO PAST SURGERIES       Current Outpatient Medications:  .  levonorgestrel (MIRENA) 20 MCG/24HR IUD, 1 each by Intrauterine route once., Disp: , Rfl:  .  albuterol (PROVENTIL HFA;VENTOLIN HFA) 108 (90 Base) MCG/ACT inhaler, Inhale 2 puffs into the lungs every 4 (four) hours  as needed for wheezing or shortness of breath (cough, shortness of breath or wheezing.). (Patient not taking: Reported on 08/08/2019), Disp: 1 Inhaler, Rfl: 1 .  clindamycin (CLEOCIN) 300 MG capsule, Take 1 capsule (300 mg total) by mouth 3 (three) times daily. (Patient not taking: Reported on 08/08/2019), Disp: 21 capsule, Rfl: 2 .  cyclobenzaprine (FLEXERIL) 10 MG tablet, Take 1 tablet (10 mg total) by mouth every 8 (eight) hours as needed for muscle spasms. (Patient not taking: Reported on 08/08/2019), Disp: 30 tablet, Rfl: 1 .  ibuprofen (ADVIL,MOTRIN) 800 MG tablet, Take 1 tablet (800 mg total) by mouth every 8 (eight) hours as needed. (Patient not taking: Reported on 08/08/2019), Disp: 30 tablet, Rfl: 5 .  Spacer/Aero Chamber Mouthpiece MISC, 1 Device by Does not apply route 4 (four) times daily as needed. (Patient not taking: Reported on 08/08/2019), Disp: 1 each, Rfl: 0 No Known Allergies  Social History   Tobacco Use  . Smoking status: Current Every Day Smoker    Packs/day: 0.50    Types: Cigarettes  . Smokeless tobacco: Never Used  Substance Use Topics  . Alcohol use: Yes    Comment: Occas. when not preg.    Family History  Problem Relation Age of Onset  . Breast cancer Mother   . Breast cancer Maternal Aunt       Review of Systems  Constitutional: negative  for fatigue and weight loss Respiratory: negative for cough and wheezing Cardiovascular: negative for chest pain, fatigue and palpitations Gastrointestinal: negative for abdominal pain and change in bowel habits Musculoskeletal:negative for myalgias Neurological: negative for gait problems and tremors Behavioral/Psych: negative for abusive relationship, depression Endocrine: negative for temperature intolerance    Genitourinary:negative for abnormal menstrual periods, genital lesions, hot flashes, sexual problems and vaginal discharge Integument/breast: negative for breast lump, breast tenderness, nipple discharge and skin  lesion(s)    Objective:       BP 125/83   Pulse 75   Wt 215 lb (97.5 kg)   BMI 32.69 kg/m  General:   alert  Skin:   no rash or abnormalities  Lungs:   clear to auscultation bilaterally  Heart:   regular rate and rhythm, S1, S2 normal, no murmur, click, rub or gallop  Breasts:   normal without suspicious masses, skin or nipple changes or axillary nodes  Abdomen:  normal findings: no organomegaly, soft, non-tender and no hernia  Pelvis:  External genitalia: normal general appearance Urinary system: urethral meatus normal and bladder without fullness, nontender Vaginal: normal without tenderness, induration or masses Cervix: normal appearance Adnexa: normal bimanual exam Uterus: anteverted and non-tender, normal size   Lab Review Urine pregnancy test Labs reviewed yes Radiologic studies reviewed no  50% of 20 min visit spent on counseling and coordination of care.   Assessment:     1. Encounter for routine gynecological examination with Papanicolaou smear of cervix Rx: - Cytology - PAP( [place]) - CBC with Differential/Platelet - Comprehensive metabolic panel - TSH - Cholesterol, total - Triglycerides - Hemoglobin A1c  2. Vaginal discharge Rx: - Cervicovaginal ancillary only( [place])  3. Screening for STD (sexually transmitted disease) Rx: - HIV Antibody (routine testing w rflx) - Hepatitis B surface antigen - RPR - Hepatitis C antibody  4. IUD check up - doing well     Plan:    Education reviewed: calcium supplements, depression evaluation, low fat, low cholesterol diet, safe sex/STD prevention, self breast exams, smoking cessation and weight bearing exercise. Contraception: IUD. Follow up in: 1 year.    Orders Placed This Encounter  Procedures  . HIV Antibody (routine testing w rflx)  . Hepatitis B surface antigen  . RPR  . Hepatitis C antibody  . CBC with Differential/Platelet  . Comprehensive metabolic panel  . TSH  . Cholesterol,  total  . Triglycerides  . Hemoglobin A1c    Shelly Bombard, MD 08/08/2019 9:30 AM

## 2019-08-09 LAB — COMPREHENSIVE METABOLIC PANEL
ALT: 14 IU/L (ref 0–32)
AST: 15 IU/L (ref 0–40)
Albumin/Globulin Ratio: 1.4 (ref 1.2–2.2)
Albumin: 4.3 g/dL (ref 3.9–5.0)
Alkaline Phosphatase: 79 IU/L (ref 39–117)
BUN/Creatinine Ratio: 13 (ref 9–23)
BUN: 8 mg/dL (ref 6–20)
Bilirubin Total: 0.2 mg/dL (ref 0.0–1.2)
CO2: 21 mmol/L (ref 20–29)
Calcium: 9.4 mg/dL (ref 8.7–10.2)
Chloride: 105 mmol/L (ref 96–106)
Creatinine, Ser: 0.64 mg/dL (ref 0.57–1.00)
GFR calc Af Amer: 141 mL/min/{1.73_m2} (ref >59)
GFR calc non Af Amer: 123 mL/min/{1.73_m2} (ref >59)
Globulin, Total: 3 g/dL (ref 1.5–4.5)
Glucose: 96 mg/dL (ref 65–99)
Potassium: 4.2 mmol/L (ref 3.5–5.2)
Sodium: 138 mmol/L (ref 134–144)
Total Protein: 7.3 g/dL (ref 6.0–8.5)

## 2019-08-09 LAB — HEMOGLOBIN A1C
Est. average glucose Bld gHb Est-mCnc: 126 mg/dL
Hgb A1c MFr Bld: 6 % — ABNORMAL HIGH (ref 4.8–5.6)

## 2019-08-09 LAB — CBC WITH DIFFERENTIAL/PLATELET
Basophils Absolute: 0 10*3/uL (ref 0.0–0.2)
Basos: 0 %
EOS (ABSOLUTE): 0.1 10*3/uL (ref 0.0–0.4)
Eos: 1 %
Hematocrit: 38.6 % (ref 34.0–46.6)
Hemoglobin: 12.9 g/dL (ref 11.1–15.9)
Immature Grans (Abs): 0 10*3/uL (ref 0.0–0.1)
Immature Granulocytes: 0 %
Lymphocytes Absolute: 2.6 10*3/uL (ref 0.7–3.1)
Lymphs: 24 %
MCH: 30 pg (ref 26.6–33.0)
MCHC: 33.4 g/dL (ref 31.5–35.7)
MCV: 90 fL (ref 79–97)
Monocytes Absolute: 0.6 10*3/uL (ref 0.1–0.9)
Monocytes: 5 %
Neutrophils Absolute: 7.6 10*3/uL — ABNORMAL HIGH (ref 1.4–7.0)
Neutrophils: 70 %
Platelets: 254 10*3/uL (ref 150–450)
RBC: 4.3 x10E6/uL (ref 3.77–5.28)
RDW: 12.6 % (ref 11.7–15.4)
WBC: 10.9 10*3/uL — ABNORMAL HIGH (ref 3.4–10.8)

## 2019-08-09 LAB — TSH: TSH: 1.52 u[IU]/mL (ref 0.450–4.500)

## 2019-08-09 LAB — TRIGLYCERIDES: Triglycerides: 92 mg/dL (ref 0–149)

## 2019-08-09 LAB — HEPATITIS B SURFACE ANTIGEN: Hepatitis B Surface Ag: NEGATIVE

## 2019-08-09 LAB — HEPATITIS C ANTIBODY: Hep C Virus Ab: <0.1 s/co ratio (ref 0.0–0.9)

## 2019-08-09 LAB — HIV ANTIBODY (ROUTINE TESTING W REFLEX): HIV Screen 4th Generation wRfx: NONREACTIVE

## 2019-08-09 LAB — RPR: RPR Ser Ql: NONREACTIVE

## 2019-08-09 LAB — CHOLESTEROL, TOTAL: Cholesterol, Total: 154 mg/dL (ref 100–199)

## 2019-08-10 ENCOUNTER — Other Ambulatory Visit: Payer: Self-pay | Admitting: Obstetrics

## 2019-08-10 DIAGNOSIS — L02429 Furuncle of limb, unspecified: Secondary | ICD-10-CM

## 2019-08-10 LAB — CERVICOVAGINAL ANCILLARY ONLY
Bacterial Vaginitis (gardnerella): POSITIVE — AB
Candida Glabrata: NEGATIVE
Candida Vaginitis: NEGATIVE
Chlamydia: NEGATIVE
Comment: NEGATIVE
Comment: NEGATIVE
Comment: NEGATIVE
Comment: NEGATIVE
Comment: NEGATIVE
Comment: NORMAL
Neisseria Gonorrhea: NEGATIVE
Trichomonas: NEGATIVE

## 2019-08-10 MED ORDER — CLINDAMYCIN HCL 300 MG PO CAPS: 300.0000 mg | ORAL_CAPSULE | Freq: Three times a day (TID) | ORAL | 2 refills | Status: DC

## 2019-08-15 ENCOUNTER — Ambulatory Visit: Payer: Medicaid Other | Admitting: Internal Medicine

## 2019-08-15 ENCOUNTER — Other Ambulatory Visit: Payer: Self-pay

## 2019-08-15 ENCOUNTER — Encounter: Payer: Self-pay | Admitting: Internal Medicine

## 2019-08-15 VITALS — BP 119/76 | HR 70 | Temp 98.2°F | Ht 68.0 in | Wt 217.6 lb

## 2019-08-15 DIAGNOSIS — F17210 Nicotine dependence, cigarettes, uncomplicated: Secondary | ICD-10-CM | POA: Diagnosis not present

## 2019-08-15 DIAGNOSIS — F331 Major depressive disorder, recurrent, moderate: Secondary | ICD-10-CM

## 2019-08-15 DIAGNOSIS — R7303 Prediabetes: Secondary | ICD-10-CM | POA: Diagnosis not present

## 2019-08-15 DIAGNOSIS — L0202 Furuncle of face: Secondary | ICD-10-CM

## 2019-08-15 DIAGNOSIS — Z720 Tobacco use: Secondary | ICD-10-CM

## 2019-08-15 MED ORDER — ESCITALOPRAM OXALATE 10 MG PO TABS: 10.0000 mg | ORAL_TABLET | Freq: Every day | ORAL | 2 refills | Status: DC

## 2019-08-15 MED ORDER — CHANTIX STARTING MONTH PAK 0.5 MG X 11 & 1 MG X 42 PO TABS: ORAL_TABLET | ORAL | 0 refills | Status: DC

## 2019-08-15 NOTE — Assessment & Plan Note (Addendum)
Hannah Stone is a 29 yo F w/ PMH of asthma presenting to [facility] to establish care after being informed that her a1c is in pre-diabetes range. She mentions that she has multiple family members in diabetes and would like to avoid progression to diabetes if possible. She states that she has difficulty with her eating habits which she attributes in part to her mood. She states that her regular meals consist of rice and protein but has preference for sweet snacks. She also mentions regular consumption of soda and juice. She denies any polyuria or polydipsia. Denies any numbness, tingling, nausea, vomiting. She states that she would like to avoid medical therapy for diabetes at this time.  A/P Lab Results  Component Value Date   HGBA1C 6.0 (H) 08/08/2019   Hemoglobin a1c on screening labs from Ob/Gyn office with 6.0 consistent with pre-diabetes. Had detailed discussion on importance of avoiding sugary snacks and drinks and importance of including protein and vegetables to her diet. Also discussed monitoring weight and treating her depression to assist in her eating habits. She also requests additional assistance and information with her nutrition.  - Advised to follow diabetic diet plan and plan for continued monitoring - Referral for nutrition and diabetic education

## 2019-08-15 NOTE — Progress Notes (Signed)
CC: Facial boil  HPI: [name] is a 29 y.o. with PMH listed below presenting with complaint of facial furuncle. Please see problem based assessment and plan for further details.  Past Medical History:  Diagnosis Date  . Asthma    Family History Father and other members from paternal side had diabetes. Unable to provide further details.  Social History Lives with mother. Used to work at [employer] but not currently. Has two children. Drinks socially every 2-3 months. 1 ppd tobacco use. Denies illicit substance use.  Review of Systems: Review of Systems  Constitutional: Negative for chills, fever and malaise/fatigue.  Eyes: Negative for blurred vision.  Respiratory: Negative for shortness of breath.   Cardiovascular: Negative for chest pain, palpitations and leg swelling.  Gastrointestinal: Negative for constipation, diarrhea, nausea and vomiting.  Genitourinary: Negative for dysuria.  Psychiatric/Behavioral: Positive for depression. The patient is nervous/anxious and has insomnia.   All other systems reviewed and are negative.   Physical Exam: Vitals:   08/15/19 1421  BP: 119/76  Pulse: 70  Temp: 98.2 F (36.8 C)  TempSrc: Oral  SpO2: 100%  Weight: 217 lb 9.6 oz (98.7 kg)  Height: 5\' 8"  (1.727 m)   Physical Exam  Constitutional: She is oriented to person, place, and time. She appears well-developed and well-nourished. No distress.  Cardiovascular: Normal rate, regular rhythm, normal heart sounds and intact distal pulses.  No murmur heard. Respiratory: Effort normal and breath sounds normal. She has no wheezes. She has no rales.  GI: Soft. Bowel sounds are normal. She exhibits no distension. There is no abdominal tenderness.  Musculoskeletal:        General: No edema. Normal range of motion.  Neurological: She is alert and oriented to person, place, and time.  Skin: Skin is warm and dry.  ~1cm facial furuncle with surrounding erythema without open  ulcer or drainage. Left axillary furuncle without drainage or pain.  Psychiatric: She has a normal mood and affect.    Assessment & Plan:   Tobacco use Smokes 1/2 - 1 ppd. Currently denies any shortness of breath or wheezing. Mentions desire to quit. Agreeable to trying varenicline.  - Chantix pack sent to pharmacy  Moderate episode of recurrent major depressive disorder (Hannah Stone) Noted to have PHQ-9 of 24 on depression screen. When prompted, mentions history of depressed mood since Jul 24, 2015 after her father passed away. She mentions difficulty with weight management due to episodes of loss of appetite and overeating. She also mentions difficulty with sleep and history of thoughts of suicide in the past. She currently denies any suicidal or homicidal ideations. She is agreeable to initiating therapy and is also open to seeing integrated behavioral health specialist.  - Start escitalopram 10mg  daily, titrate up to 20mg  after 7 days - Referral for integrated behavioral health  Pre-diabetes [name] is a 29 yo F w/ PMH of asthma presenting to [facility] to establish care after being informed that her a1c is in pre-diabetes range. She mentions that she has multiple family members in diabetes and would like to avoid progression to diabetes if possible. She states that she has difficulty with her eating habits which she attributes in part to her mood. She states that her regular meals consist of rice and protein but has preference for sweet snacks. She also mentions regular consumption of soda and juice. She denies any polyuria or polydipsia. Denies any numbness, tingling, nausea, vomiting. She states that she would like to avoid medical therapy for diabetes  at this time.  A/P Lab Results  Component Value Date   HGBA1C 6.0 (H) 08/08/2019   Hemoglobin a1c on screening labs from Ob/Gyn office with 6.0 consistent with pre-diabetes. Had detailed discussion on importance of avoiding sugary snacks and drinks and  importance of including protein and vegetables to her diet. Also discussed monitoring weight and treating her depression to assist in her eating habits. She also requests additional assistance and information with her nutrition.  - Advised to follow diabetic diet plan and plan for continued monitoring - Referral for nutrition and diabetic education  Furuncle of cheek Presents with complaint of furuncle on her cheek. 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 States that despite using neosporin and tree-seed oil and other face cleansing product, continued presence. Denies any fevers or chills but expresses concern regarding prolonged chronicity. Also mentions attempted lancing at home. States that she was recently started on clindamycin for similar boil on her L axilla per her Ob/Gyn. She requests referral for dermatology as she feels she needs specialist care.  - Discussed expected improvement with antibiotics and importance of avoiding contamination of the affected area by repeated touching - C/w clindamycin as prescribed - Referral to dermatology per patient preference/second opinion   Patient discussed with Dr. Heide Stone   -Hannah Stone, PGY2 [facility] Internal Medicine Pager: [phone]

## 2019-08-15 NOTE — Patient Instructions (Addendum)
Thank you for allowing [place] to provide your care today. Today we discussed your pre-diabetes and your mood    I have ordered no labs for you. I will call if any are abnormal.    Today we made the following changes to your medications.    Please take escitalopram 10mg  daily  Please follow-up in 3 months.    Should you have any questions or concerns please call the internal medicine clinic at [phone].     Skin Abscess  A skin abscess is an infected area of your skin that contains pus and other material. An abscess can happen in any part of your body. Some abscesses break open (rupture) on their own. Most continue to get worse unless they are treated. The infection can spread deeper into the body and into your blood, which can make you feel sick. A skin abscess is caused by germs that enter the skin through a cut or scrape. It can also be caused by blocked oil and sweat glands or infected hair follicles. This condition is usually treated by:  Draining the pus.  Taking antibiotic medicines.  Placing a warm, wet washcloth over the abscess. Follow these instructions at home: Medicines   Take over-the-counter and prescription medicines only as told by your doctor.  If you were prescribed an antibiotic medicine, take it as told by your doctor. Do not stop taking the antibiotic even if you start to feel better. Abscess care   If you have an abscess that has not drained, place a warm, clean, wet washcloth over the abscess several times a day. Do this as told by your doctor.  Follow instructions from your doctor about how to take care of your abscess. Make sure you: ? Cover the abscess with a bandage (dressing). ? Change your bandage or gauze as told by your doctor. ? Wash your hands with soap and water before you change the bandage or gauze. If you cannot use soap and water, use hand sanitizer.  Check your abscess every day for signs that the infection is getting worse. Check  for: ? More redness, swelling, or pain. ? More fluid or blood. ? Warmth. ? More pus or a bad smell. General instructions  To avoid spreading the infection: ? Do not share personal care items, towels, or hot tubs with others. ? Avoid making skin-to-skin contact with other people.  Keep all follow-up visits as told by your doctor. This is important. Contact a doctor if:  You have more redness, swelling, or pain around your abscess.  You have more fluid or blood coming from your abscess.  Your abscess feels warm when you touch it.  You have more pus or a bad smell coming from your abscess.  You have a fever.  Your muscles ache.  You have chills.  You feel sick. Get help right away if:  You have very bad (severe) pain.  You see red streaks on your skin spreading away from the abscess. Summary  A skin abscess is an infected area of your skin that contains pus and other material.  The abscess is caused by germs that enter the skin through a cut or scrape. It can also be caused by blocked oil and sweat glands or infected hair follicles.  Follow your doctor's instructions on caring for your abscess, taking medicines, preventing infections, and keeping follow-up visits. This information is not intended to replace advice given to you by your health care provider. Make sure you discuss any questions  you have with your health care provider. Document Revised: 10/26/2018 Document Reviewed: 05/05/2017 Elsevier Patient Education  2020 [employer].

## 2019-08-15 NOTE — Assessment & Plan Note (Signed)
Presents with complaint of furuncle on her cheek. 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 States that despite using neosporin and tree-seed oil and other face cleansing product, continued presence. Denies any fevers or chills but expresses concern regarding prolonged chronicity. Also mentions attempted lancing at home. States that she was recently started on clindamycin for similar boil on her L axilla per her Ob/Gyn. She requests referral for dermatology as she feels she needs specialist care.  - Discussed expected improvement with antibiotics and importance of avoiding contamination of the affected area by repeated touching - C/w clindamycin as prescribed - Referral to dermatology per patient preference/second opinion

## 2019-08-15 NOTE — Assessment & Plan Note (Signed)
Noted to have PHQ-9 of 24 on depression screen. When prompted, mentions history of depressed mood since 30-Jul-2015 after her father passed away. She mentions difficulty with weight management due to episodes of loss of appetite and overeating. She also mentions difficulty with sleep and history of thoughts of suicide in the past. She currently denies any suicidal or homicidal ideations. She is agreeable to initiating therapy and is also open to seeing integrated behavioral health specialist.  - Start escitalopram 10mg  daily, titrate up to 20mg  after 7 days - Referral for integrated behavioral health

## 2019-08-15 NOTE — Assessment & Plan Note (Signed)
Smokes 1/2 - 1 ppd. Currently denies any shortness of breath or wheezing. Mentions desire to quit. Agreeable to trying varenicline.  - Chantix pack sent to pharmacy

## 2019-08-16 LAB — CYTOLOGY - PAP
Comment: NEGATIVE
Comment: NEGATIVE
Diagnosis: UNDETERMINED — AB
HPV 16: NEGATIVE
HPV 18 / 45: NEGATIVE
High risk HPV: POSITIVE — AB

## 2019-08-20 NOTE — Progress Notes (Signed)
Internal Medicine Clinic Attending ° °Case discussed with Dr. Lee at the time of the visit.  We reviewed the resident’s history and exam and pertinent patient test results.  I agree with the assessment, diagnosis, and plan of care documented in the resident’s note.  °

## 2019-08-23 ENCOUNTER — Telehealth: Payer: Self-pay | Admitting: Dietician

## 2019-08-23 NOTE — Telephone Encounter (Signed)
Left voicemail for return call Mailed letter with information about caring for prediabetes Norm Parcel, RD 08/23/2019 1:27 PM.

## 2019-08-24 DIAGNOSIS — L720 Epidermal cyst: Secondary | ICD-10-CM | POA: Diagnosis not present

## 2019-08-24 DIAGNOSIS — L738 Other specified follicular disorders: Secondary | ICD-10-CM | POA: Diagnosis not present

## 2019-08-24 DIAGNOSIS — L818 Other specified disorders of pigmentation: Secondary | ICD-10-CM | POA: Diagnosis not present

## 2019-09-12 ENCOUNTER — Telehealth: Payer: Self-pay | Admitting: Licensed Clinical Social Worker

## 2019-09-12 NOTE — Telephone Encounter (Signed)
Patient was called to get on my schedule. Patient will be added for 6/29 @ 1:00 via phone.

## 2019-10-02 ENCOUNTER — Telehealth: Payer: Self-pay | Admitting: Licensed Clinical Social Worker

## 2019-10-02 ENCOUNTER — Ambulatory Visit: Payer: Medicaid Other | Admitting: Licensed Clinical Social Worker

## 2019-10-02 NOTE — Telephone Encounter (Signed)
Patient was called for our scheduled visit. Patient did not answer, and a vm was left for the patient.

## 2019-10-02 NOTE — Addendum Note (Signed)
Addended by: Neomia Dear on: 10/02/2019 06:28 PM   Modules accepted: Orders

## 2019-10-27 ENCOUNTER — Ambulatory Visit: Payer: Medicaid Other | Attending: Internal Medicine

## 2019-10-27 DIAGNOSIS — Z23 Encounter for immunization: Secondary | ICD-10-CM

## 2019-10-27 NOTE — Progress Notes (Signed)
   Covid-19 Vaccination Clinic  Name:  Hannah Stone    MRN: [id number] DOB: 1991-02-16  10/27/2019  [name] was observed post Covid-19 immunization for 15 minutes without incident. She was provided with Vaccine Information Sheet and instruction to access the V-Safe system.   [name] was instructed to call 911 with any severe reactions post vaccine: Marland Kitchen Difficulty breathing  . Swelling of face and throat  . A fast heartbeat  . A bad rash all over body  . Dizziness and weakness   Immunizations Administered    Name Date Dose VIS Date Route   Pfizer COVID-19 Vaccine 10/27/2019 10:36 AM 0.3 mL 05/30/2018 Intramuscular   Manufacturer: [employer], [employer]   Lot: [id number]   NDC: [address]-2

## 2019-11-20 ENCOUNTER — Ambulatory Visit: Payer: Self-pay

## 2020-04-10 ENCOUNTER — Other Ambulatory Visit: Payer: Medicaid Other

## 2020-04-10 DIAGNOSIS — Z20822 Contact with and (suspected) exposure to covid-19: Secondary | ICD-10-CM

## 2020-04-12 LAB — SARS-COV-2, NAA 2 DAY TAT

## 2020-04-12 LAB — NOVEL CORONAVIRUS, NAA: SARS-CoV-2, NAA: NOT DETECTED

## 2020-04-17 ENCOUNTER — Other Ambulatory Visit: Payer: Medicaid Other

## 2020-05-13 DIAGNOSIS — Z03818 Encounter for observation for suspected exposure to other biological agents ruled out: Secondary | ICD-10-CM | POA: Diagnosis not present

## 2020-10-07 ENCOUNTER — Encounter: Payer: Self-pay | Admitting: *Deleted

## 2020-10-28 ENCOUNTER — Encounter (HOSPITAL_COMMUNITY): Payer: Self-pay

## 2020-10-28 ENCOUNTER — Ambulatory Visit (HOSPITAL_COMMUNITY)
Admission: RE | Admit: 2020-10-28 | Discharge: 2020-10-28 | Disposition: A | Discharge status: Home / Self Care | Payer: Medicaid Other | Source: Ambulatory Visit | Attending: Physician Assistant | Admitting: Physician Assistant

## 2020-10-28 ENCOUNTER — Other Ambulatory Visit: Payer: Self-pay

## 2020-10-28 VITALS — BP 119/81 | HR 95 | Temp 99.4°F | Resp 18

## 2020-10-28 DIAGNOSIS — L0501 Pilonidal cyst with abscess: Secondary | ICD-10-CM

## 2020-10-28 MED ORDER — LIDOCAINE-EPINEPHRINE 1 %-1:100000 IJ SOLN
INTRAMUSCULAR | Status: AC
  Filled 2020-10-28: qty 1

## 2020-10-28 MED ORDER — DOXYCYCLINE HYCLATE 100 MG PO CAPS: 100.0000 mg | ORAL_CAPSULE | Freq: Two times a day (BID) | ORAL | 0 refills | Status: AC

## 2020-10-28 NOTE — ED Provider Notes (Signed)
[facility]    CSN: [id number] Arrival date & time: 10/28/20  0848      History   Chief Complaint No chief complaint on file.   HPI Hannah Stone is a 30 y.o. female.   Patient presents today with a several day history of abscess at the top of her gluteal cleft.  She does report a history of recurrent abscesses but has never had abscess in this area.  She denies history of recurrent skin infections or history of MRSA.  She denies history of diabetes or immunosuppression.  Denies any recent antibiotic use.  Reports pain is rated 10 on a 0-10 pain scale, localized to affected area, described as aching/throbbing, worse with palpation or prolonged sitting, no relieving factors identified.  She has not tried any over-the-counter medications for symptom management.  Denies previous I&D.   Past Medical History:  Diagnosis Date   Asthma     Patient Active Problem List   Diagnosis Date Noted   Moderate episode of recurrent major depressive disorder (HCC) 08/15/2019   Pre-diabetes 08/15/2019   Tobacco use 08/15/2019   Furuncle of cheek 08/15/2019   BMI 31.0-31.9,adult 10/16/2015   History of preterm delivery 10/14/2015    Past Surgical History:  Procedure Laterality Date   NO PAST SURGERIES      OB History     Gravida  2   Para  2   Term  1   Preterm  1   AB  0   Living  2      SAB  0   IAB  0   Ectopic  0   Multiple  0   Live Births  2            Home Medications    Prior to Admission medications   Medication Sig Start Date End Date Taking? Authorizing Provider  doxycycline (VIBRAMYCIN) 100 MG capsule Take 1 capsule (100 mg total) by mouth 2 (two) times daily for 7 days. 10/28/20 11/04/20 Yes Jesika Men K, PA-C  escitalopram (LEXAPRO) 10 MG tablet Take 1 tablet (10 mg total) by mouth daily. 08/15/19 08/14/20  Theotis Barrio, MD  levonorgestrel (MIRENA) 20 MCG/24HR IUD 1 each by Intrauterine route once.    [provider]   varenicline (CHANTIX STARTING MONTH PAK) 0.5 MG X 11 & 1 MG X 42 tablet Take one 0.5 mg tablet by mouth once daily for 3 days, then increase to one 0.5 mg tablet twice daily for 4 days, then increase to one 1 mg tablet twice daily. 08/15/19   Theotis Barrio, MD    Family History Family History  Problem Relation Age of Onset   Breast cancer Mother    Breast cancer Maternal Aunt     Social History Social History   Tobacco Use   Smoking status: Every Day    Packs/day: 1.00    Types: Cigarettes   Smokeless tobacco: Never  Vaping Use   Vaping Use: Never used  Substance Use Topics   Alcohol use: Yes    Comment: Occas. when not preg.   Drug use: No     Allergies   Patient has no known allergies.   Review of Systems Review of Systems  Constitutional:  Negative for activity change, appetite change, fatigue and fever.  Respiratory:  Negative for cough and shortness of breath.   Cardiovascular:  Negative for chest pain.  Gastrointestinal:  Negative for abdominal pain, diarrhea, nausea and vomiting.  Skin:  Positive for color change and wound.  Neurological:  Negative for dizziness, light-headedness and headaches.    Physical Exam Triage Vital Signs ED Triage Vitals [10/28/20 0924]  Enc Vitals Group     BP 119/81     Pulse Rate 95     Resp 18     Temp 99.4 F (37.4 C)     Temp Source Oral     SpO2 100 %     Weight      Height      Head Circumference      Peak Flow      Pain Score 10     Pain Loc      Pain Edu?      Excl. in GC?    No data found.  Updated Vital Signs BP 119/81   Pulse 95   Temp 99.4 F (37.4 C) (Oral)   Resp 18   LMP 10/14/2020   SpO2 100%   Visual Acuity Right Eye Distance:   Left Eye Distance:   Bilateral Distance:    Right Eye Near:   Left Eye Near:    Bilateral Near:     Physical Exam Vitals reviewed.  Constitutional:      General: She is awake. She is not in acute distress.    Appearance: Normal appearance. She is normal  weight. She is not ill-appearing.     Comments: Very pleasant female appears stated age no acute distress  HENT:     Head: Normocephalic and atraumatic.  Cardiovascular:     Rate and Rhythm: Normal rate and regular rhythm.     Heart sounds: Normal heart sounds, S1 normal and S2 normal. No murmur heard. Pulmonary:     Effort: Pulmonary effort is normal.     Breath sounds: Normal breath sounds. No wheezing, rhonchi or rales.     Comments: Clear to auscultation bilaterally Skin:    Findings: Abscess present.          Comments: 3 cm x 1 cm nodule with central fluctuance noted superior gluteal cleft.  No active drainage or bleeding noted.  No streaking or evidence of lymphangitis.  Psychiatric:        Behavior: Behavior is cooperative.     UC Treatments / Results  Labs (all labs ordered are listed, but only abnormal results are displayed) Labs Reviewed - No data to display  EKG   Radiology No results found.  Procedures Incision and Drainage  Date/Time: 10/28/2020 10:51 AM Performed by: Jeani Hawking, PA-C Authorized by: Jeani Hawking, PA-C   Consent:    Consent obtained:  Verbal   Consent given by:  Patient   Risks, benefits, and alternatives were discussed: yes     Risks discussed:  Bleeding, incomplete drainage, pain and infection   Alternatives discussed:  Delayed treatment, alternative treatment, observation and referral Universal protocol:    Procedure explained and questions answered to patient or proxy's satisfaction: yes     Patient identity confirmed:  Verbally with patient Location:    Type:  Pilonidal cyst   Size:  3 cm x 1 cm   Location:  Anogenital   Anogenital location:  Pilonidal Pre-procedure details:    Skin preparation:  Chlorhexidine Sedation:    Sedation type:  None Anesthesia:    Anesthesia method:  Local infiltration   Local anesthetic:  Lidocaine 1% WITH epi Procedure type:    Complexity:  Simple Procedure details:    Ultrasound  guidance: no  Needle aspiration: no     Incision types:  Single straight   Incision depth:  Dermal   Wound management:  Probed and deloculated and irrigated with saline   Drainage:  Bloody and purulent   Drainage amount:  Moderate   Wound treatment:  Wound left open   Packing materials:  None Post-procedure details:    Procedure completion:  Tolerated well, no immediate complications (including critical care time)  Medications Ordered in UC Medications - No data to display  Initial Impression / Assessment and Plan / UC Course  I have reviewed the triage vital signs and the nursing notes.  Pertinent labs & imaging results that were available during my care of the patient were reviewed by me and considered in my medical decision making (see chart for details).     Pilonidal drained in clinic (see above for procedure note).  Patient was started on doxycycline.  She has no concern for pregnancy.  Discussed that symptoms are likely to recur and she was given contact information for local surgeon office.  Discussed alarm symptoms that warrant emergent evaluation to which patient expressed understanding.  Strict return precautions given.  Final Clinical Impressions(s) / UC Diagnoses   Final diagnoses:  Pilonidal abscess     Discharge Instructions      Take doxycycline twice daily for 7 days.  Stay out of the sun while on this medication.  The area will continue to drain but as long as it is not draining a significant amount of fluid that is okay.  As we discussed, this can recur so please follow-up with surgeon to consider removal if necessary.  If you develop any additional symptoms including fever, nausea, vomiting, weakness you need to go to the emergency room.     ED Prescriptions     Medication Sig Dispense Auth. Provider   doxycycline (VIBRAMYCIN) 100 MG capsule Take 1 capsule (100 mg total) by mouth 2 (two) times daily for 7 days. 14 capsule Renell Allum K, PA-C       PDMP not reviewed this encounter.   Jeani Hawking, PA-C 10/28/20 1053

## 2020-10-28 NOTE — ED Triage Notes (Signed)
Pt has abscess between cheeks of buttocks.

## 2020-10-28 NOTE — Discharge Instructions (Addendum)
Take doxycycline twice daily for 7 days.  Stay out of the sun while on this medication.  The area will continue to drain but as long as it is not draining a significant amount of fluid that is okay.  As we discussed, this can recur so please follow-up with surgeon to consider removal if necessary.  If you develop any additional symptoms including fever, nausea, vomiting, weakness you need to go to the emergency room.

## 2020-12-04 ENCOUNTER — Ambulatory Visit (HOSPITAL_COMMUNITY)
Admission: EM | Admit: 2020-12-04 | Discharge: 2020-12-04 | Disposition: A | Discharge status: Home / Self Care | Payer: Medicaid Other | Attending: Emergency Medicine | Admitting: Emergency Medicine

## 2020-12-04 ENCOUNTER — Encounter (HOSPITAL_COMMUNITY): Payer: Self-pay

## 2020-12-04 ENCOUNTER — Other Ambulatory Visit: Payer: Self-pay

## 2020-12-04 DIAGNOSIS — J988 Other specified respiratory disorders: Secondary | ICD-10-CM

## 2020-12-04 DIAGNOSIS — B9789 Other viral agents as the cause of diseases classified elsewhere: Secondary | ICD-10-CM | POA: Diagnosis not present

## 2020-12-04 DIAGNOSIS — J4521 Mild intermittent asthma with (acute) exacerbation: Secondary | ICD-10-CM | POA: Diagnosis not present

## 2020-12-04 MED ORDER — AEROCHAMBER PLUS FLO-VU LARGE MISC
Status: AC
  Filled 2020-12-04: qty 1

## 2020-12-04 MED ORDER — AEROCHAMBER PLUS FLO-VU MEDIUM MISC
1.0000 | Freq: Once | Status: AC
  Administered 2020-12-04: 1

## 2020-12-04 MED ORDER — ALBUTEROL SULFATE HFA 108 (90 BASE) MCG/ACT IN AERS
2.0000 | INHALATION_SPRAY | Freq: Once | RESPIRATORY_TRACT | Status: AC
  Administered 2020-12-04: 2 via RESPIRATORY_TRACT

## 2020-12-04 MED ORDER — ALBUTEROL SULFATE HFA 108 (90 BASE) MCG/ACT IN AERS
INHALATION_SPRAY | RESPIRATORY_TRACT | Status: AC
  Filled 2020-12-04: qty 6.7

## 2020-12-04 NOTE — ED Provider Notes (Signed)
[facility]    CSN: [id number] Arrival date & time: 12/04/20  0808      History   Chief Complaint Chief Complaint  Patient presents with   Shortness of Breath   Headache    HPI Hannah Stone is a 30 y.o. female.  Patient reports feeling ill for 4 days.  Started with runny nose and mild cough.  Has progressed to include sore throat that has now resolved, headache, and wheezing.  Wheezing is her main concern.  She had asthma as a child and reports she is still will have asthma symptoms such as wheezing when she has colds.  She has been wheezing for the last 2 to 3 days and feels she needs an inhaler.  Denies fever.  Does feel like she has had chills.  Patient took a PCR COVID test 2 days ago that was negative.   Shortness of Breath Associated symptoms: headaches, sore throat and wheezing   Associated symptoms: no abdominal pain, no ear pain, no fever and no vomiting   Headache Associated symptoms: congestion, nausea and sore throat   Associated symptoms: no abdominal pain, no diarrhea, no ear pain, no fever and no vomiting    Past Medical History:  Diagnosis Date   Asthma     Patient Active Problem List   Diagnosis Date Noted   Moderate episode of recurrent major depressive disorder (HCC) 08/15/2019   Pre-diabetes 08/15/2019   Tobacco use 08/15/2019   Furuncle of cheek 08/15/2019   BMI 31.0-31.9,adult 10/16/2015   History of preterm delivery 10/14/2015    Past Surgical History:  Procedure Laterality Date   NO PAST SURGERIES      OB History     Gravida  2   Para  2   Term  1   Preterm  1   AB  0   Living  2      SAB  0   IAB  0   Ectopic  0   Multiple  0   Live Births  2            Home Medications    Prior to Admission medications   Medication Sig Start Date End Date Taking? Authorizing Provider  escitalopram (LEXAPRO) 10 MG tablet Take 1 tablet (10 mg total) by mouth daily. 08/15/19 08/14/20  Theotis Barrio, MD   levonorgestrel (MIRENA) 20 MCG/24HR IUD 1 each by Intrauterine route once.    [provider]  varenicline (CHANTIX STARTING MONTH PAK) 0.5 MG X 11 & 1 MG X 42 tablet Take one 0.5 mg tablet by mouth once daily for 3 days, then increase to one 0.5 mg tablet twice daily for 4 days, then increase to one 1 mg tablet twice daily. 08/15/19   Theotis Barrio, MD    Family History Family History  Problem Relation Age of Onset   Breast cancer Mother    Breast cancer Maternal Aunt     Social History Social History   Tobacco Use   Smoking status: Every Day    Packs/day: 1.00    Types: Cigarettes   Smokeless tobacco: Never  Vaping Use   Vaping Use: Never used  Substance Use Topics   Alcohol use: Yes    Comment: Occas. when not preg.   Drug use: No     Allergies   Patient has no known allergies.   Review of Systems Review of Systems  Constitutional:  Positive for chills. Negative for fever.  HENT:  Positive for congestion, rhinorrhea and sore throat. Negative for ear pain.   Respiratory:  Positive for shortness of breath and wheezing.   Gastrointestinal:  Positive for nausea. Negative for abdominal pain, diarrhea and vomiting.  Neurological:  Positive for headaches.    Physical Exam Triage Vital Signs ED Triage Vitals  Enc Vitals Group     BP 12/04/20 0854 122/86     Pulse Rate 12/04/20 0854 75     Resp 12/04/20 0854 18     Temp 12/04/20 0854 98.8 F (37.1 C)     Temp Source 12/04/20 0854 Oral     SpO2 12/04/20 0854 97 %     Weight --      Height --      Head Circumference --      Peak Flow --      Pain Score 12/04/20 0853 10     Pain Loc --      Pain Edu? --      Excl. in GC? --    No data found.  Updated Vital Signs BP 122/86 (BP Location: Left Arm)   Pulse 75   Temp 98.8 F (37.1 C) (Oral)   Resp 18   LMP  (Within Weeks) Comment: 3 weeks  SpO2 97%   Visual Acuity Right Eye Distance:   Left Eye Distance:   Bilateral Distance:    Right Eye  Near:   Left Eye Near:    Bilateral Near:     Physical Exam Constitutional:      General: She is not in acute distress.    Appearance: She is well-developed. She is ill-appearing.  Cardiovascular:     Rate and Rhythm: Normal rate and regular rhythm.  Pulmonary:     Effort: Pulmonary effort is normal. No tachypnea or respiratory distress.     Breath sounds: Examination of the right-upper field reveals wheezing. Examination of the left-upper field reveals wheezing. Examination of the right-middle field reveals wheezing. Examination of the left-middle field reveals wheezing. Examination of the right-lower field reveals wheezing. Examination of the left-lower field reveals wheezing. Wheezing present. No decreased breath sounds or rhonchi.  Lymphadenopathy:     Cervical: No cervical adenopathy.  Neurological:     Mental Status: She is alert.     UC Treatments / Results  Labs (all labs ordered are listed, but only abnormal results are displayed) Labs Reviewed - No data to display   Medications Ordered in UC Medications  albuterol (VENTOLIN HFA) 108 (90 Base) MCG/ACT inhaler 2 puff (2 puffs Inhalation Given 12/04/20 0927)  AeroChamber Plus Flo-Vu Medium MISC 1 each (1 each Other Given 12/04/20 0927)    Initial Impression / Assessment and Plan / UC Course  I have reviewed the triage vital signs and the nursing notes.  Pertinent labs & imaging results that were available during my care of the patient were reviewed by me and considered in my medical decision making (see chart for details).  Patient with diffuse moderate wheezing in bilateral lungs.  Given albuterol inhaler and spacer here at urgent care.  Use 2 puffs.  Wheezing greatly improved after albuterol, with only mild faint wheezing in the left lung.  Patient reports feeling greatly improved after use of albuterol.  Reviewed use of albuterol at home and reasons for seeking follow-up care.  Gust supportive care for viral  infection.   Final Clinical Impressions(s) / UC Diagnoses   Final diagnoses:  Viral respiratory infection  Mild intermittent asthma with acute  exacerbation     Discharge Instructions      Use the albuterol inhaler 2 puffs every 4-6 hours as needed for wheezing or shortness of breath.  If you find that you need to use it more often than not, you will need to get in to see your primary care provider or come back to urgent care for further treatment.  To new using cold medicine such as Mucinex or if you feel more congested you can add a decongestant by using Mucinex-D.     ED Prescriptions   None    PDMP not reviewed this encounter.   Cathlyn Parsons, NP 12/04/20 [phone]

## 2020-12-04 NOTE — ED Triage Notes (Signed)
Pt reports shortness of breath and headache x 3 days, shortness of breath is worse at night.

## 2020-12-04 NOTE — Discharge Instructions (Addendum)
Use the albuterol inhaler 2 puffs every 4-6 hours as needed for wheezing or shortness of breath.  If you find that you need to use it more often than not, you will need to get in to see your primary care provider or come back to urgent care for further treatment.  To new using cold medicine such as Mucinex or if you feel more congested you can add a decongestant by using Mucinex-D.

## 2020-12-09 ENCOUNTER — Other Ambulatory Visit: Payer: Self-pay

## 2020-12-09 ENCOUNTER — Other Ambulatory Visit (HOSPITAL_COMMUNITY)
Admission: RE | Admit: 2020-12-09 | Discharge: 2020-12-09 | Disposition: A | Discharge status: Home / Self Care | Payer: Medicaid Other | Source: Ambulatory Visit | Attending: Obstetrics | Admitting: Obstetrics

## 2020-12-09 ENCOUNTER — Ambulatory Visit (INDEPENDENT_AMBULATORY_CARE_PROVIDER_SITE_OTHER): Payer: Medicaid Other | Admitting: Obstetrics

## 2020-12-09 ENCOUNTER — Encounter: Payer: Self-pay | Admitting: Obstetrics

## 2020-12-09 VITALS — BP 130/85 | HR 66 | Ht 68.0 in | Wt 220.2 lb

## 2020-12-09 DIAGNOSIS — E669 Obesity, unspecified: Secondary | ICD-10-CM

## 2020-12-09 DIAGNOSIS — Z01419 Encounter for gynecological examination (general) (routine) without abnormal findings: Secondary | ICD-10-CM

## 2020-12-09 DIAGNOSIS — L0292 Furuncle, unspecified: Secondary | ICD-10-CM

## 2020-12-09 DIAGNOSIS — N898 Other specified noninflammatory disorders of vagina: Secondary | ICD-10-CM

## 2020-12-09 DIAGNOSIS — Z30431 Encounter for routine checking of intrauterine contraceptive device: Secondary | ICD-10-CM

## 2020-12-09 DIAGNOSIS — E569 Vitamin deficiency, unspecified: Secondary | ICD-10-CM

## 2020-12-09 DIAGNOSIS — R52 Pain, unspecified: Secondary | ICD-10-CM

## 2020-12-09 DIAGNOSIS — B369 Superficial mycosis, unspecified: Secondary | ICD-10-CM

## 2020-12-09 DIAGNOSIS — Z113 Encounter for screening for infections with a predominantly sexual mode of transmission: Secondary | ICD-10-CM

## 2020-12-09 MED ORDER — PRENATE MINI 18-0.6-0.4-350 MG PO CAPS: 1.0000 | ORAL_CAPSULE | Freq: Every day | ORAL | 11 refills | Status: AC

## 2020-12-09 MED ORDER — IBUPROFEN 800 MG PO TABS: 800.0000 mg | ORAL_TABLET | Freq: Three times a day (TID) | ORAL | 5 refills | Status: DC | PRN

## 2020-12-09 MED ORDER — CLOTRIMAZOLE-BETAMETHASONE 1-0.05 % EX CREA: 1.0000 "application " | TOPICAL_CREAM | Freq: Two times a day (BID) | CUTANEOUS | 0 refills | Status: DC

## 2020-12-09 NOTE — Progress Notes (Signed)
Patient ID: Hannah Stone, female   DOB: February 04, 1991, 30 y.o.   MRN: [id number]  Chief Complaint  Patient presents with   Gynecologic Exam    HPI Hannah Stone is a 30 y.o. female.  Complains of vaginal discharge and irritation.  Also c/o ? Boil vs wart on thigh. HPI  Past Medical History:  Diagnosis Date   Asthma     Past Surgical History:  Procedure Laterality Date   NO PAST SURGERIES      Family History  Problem Relation Age of Onset   Hypertension Mother    Breast cancer Mother    Diabetes Father    Hypertension Maternal Grandmother    Breast cancer Maternal Aunt     Social History Social History   Tobacco Use   Smoking status: Every Day    Packs/day: 1.00    Types: Cigarettes   Smokeless tobacco: Never  Vaping Use   Vaping Use: Never used  Substance Use Topics   Alcohol use: Yes    Comment: Occas   Drug use: No    No Known Allergies  Current Outpatient Medications  Medication Sig Dispense Refill   levonorgestrel (MIRENA) 20 MCG/24HR IUD 1 each by Intrauterine route once.     escitalopram (LEXAPRO) 10 MG tablet Take 1 tablet (10 mg total) by mouth daily. 30 tablet 2   No current facility-administered medications for this visit.    Review of Systems Review of Systems Constitutional: negative for fatigue and weight loss Respiratory: negative for cough and wheezing Cardiovascular: negative for chest pain, fatigue and palpitations Gastrointestinal: negative for abdominal pain and change in bowel habits Genitourinary:positive for vaginal discharge and irritation Integument/breast: positive for ? Wart vs Boil on thigh negative for nipple discharge Musculoskeletal:negative for myalgias Neurological: negative for gait problems and tremors Behavioral/Psych: negative for abusive relationship, depression Endocrine: negative for temperature intolerance      Blood pressure (!) 141/97, pulse 75, height 5\' 8"  (1.727 m), weight 220 lb 3.2 oz  (99.9 kg).  Physical Exam Physical Exam General:   Alert and no distress  Skin:   Boil on inner right thigh  Lungs:   clear to auscultation bilaterally  Heart:   regular rate and rhythm, S1, S2 normal, no murmur, click, rub or gallop  Breasts:   normal without suspicious masses, skin or nipple changes or axillary nodes  Abdomen:  normal findings: no organomegaly, soft, non-tender and no hernia  Pelvis:  External genitalia: normal general appearance Urinary system: urethral meatus normal and bladder without fullness, nontender Vaginal: normal without tenderness, induration or masses Cervix: normal appearance Adnexa: normal bimanual exam Uterus: anteverted and non-tender, normal size             I have spent a total of 20 minutes of face-to-face time, excluding clinical staff time, reviewing notes and preparing to see patient, ordering tests and/or medications, and counseling the patient.   Data Reviewed Labs Wet Prep and Cultures  Assessment      1. Encounter for routine gynecological examination with Papanicolaou smear of cervix Rx: - Cytology - PAP( [place]) - HgB A1c  2. Vaginal discharge Rx: - Cervicovaginal ancillary only( [place])  3. Screening for STD (sexually transmitted disease) Rx: - Hepatitis B surface antigen - Hepatitis C antibody - HIV Antibody (routine testing w rflx) - RPR  4. IUD check up - pleased with IUD  5. Obesity (BMI 35.0-39.9 without comorbidity) - weight reduction with the aid of dietary  changes, exercise and behavioral modification recommended  6. Superficial fungus infection of skin under breast folds Rx: - clotrimazole-betamethasone (LOTRISONE) cream; Apply 1 application topically 2 (two) times daily.  Dispense: 60 g; Refill: 0  7. Boil on inner right thigh - Clindamycin Rx  8. Pain from periods, myalgias, headaches, etc Rx: - ibuprofen (ADVIL) 800 MG tablet; Take 1 tablet (800 mg total) by mouth every 8 (eight) hours as  needed.  Dispense: 30 tablet; Refill: 5  9. Vitamin deficiency Rx: - Prenat-FeCbn-FeAsp-Meth-FA-DHA (PRENATE MINI) 18-0.6-0.4-350 MG CAPS; Take 1 capsule by mouth daily before breakfast.  Dispense: 30 capsule; Refill: 11    Plan  Follow up in 1 year for Annual / Pap   Brock Bad, MD 12/09/2020 10:38 AM

## 2020-12-09 NOTE — Progress Notes (Signed)
Patient presents for AEX. Patient complains of having vaginal irritation. She would like all STD screening done.  Last Pap: 08/2019 ASCUS +HRHPV

## 2020-12-10 LAB — CERVICOVAGINAL ANCILLARY ONLY
Bacterial Vaginitis (gardnerella): POSITIVE — AB
Candida Glabrata: NEGATIVE
Candida Vaginitis: NEGATIVE
Chlamydia: NEGATIVE
Comment: NEGATIVE
Comment: NEGATIVE
Comment: NEGATIVE
Comment: NEGATIVE
Comment: NEGATIVE
Comment: NORMAL
Neisseria Gonorrhea: NEGATIVE
Trichomonas: NEGATIVE

## 2020-12-10 LAB — HEMOGLOBIN A1C
Est. average glucose Bld gHb Est-mCnc: 134 mg/dL
Hgb A1c MFr Bld: 6.3 % — ABNORMAL HIGH (ref 4.8–5.6)

## 2020-12-10 LAB — RPR: RPR Ser Ql: NONREACTIVE

## 2020-12-10 LAB — HIV ANTIBODY (ROUTINE TESTING W REFLEX): HIV Screen 4th Generation wRfx: NONREACTIVE

## 2020-12-10 LAB — HEPATITIS B SURFACE ANTIGEN: Hepatitis B Surface Ag: NEGATIVE

## 2020-12-10 LAB — HEPATITIS C ANTIBODY: Hep C Virus Ab: <0.1 s/co ratio (ref 0.0–0.9)

## 2020-12-11 ENCOUNTER — Other Ambulatory Visit: Payer: Self-pay | Admitting: Obstetrics

## 2020-12-11 DIAGNOSIS — B9689 Other specified bacterial agents as the cause of diseases classified elsewhere: Secondary | ICD-10-CM

## 2020-12-11 DIAGNOSIS — N760 Acute vaginitis: Secondary | ICD-10-CM

## 2020-12-11 MED ORDER — METRONIDAZOLE 500 MG PO TABS: 500.0000 mg | ORAL_TABLET | Freq: Two times a day (BID) | ORAL | 2 refills | Status: DC

## 2020-12-15 ENCOUNTER — Other Ambulatory Visit: Payer: Self-pay

## 2020-12-15 ENCOUNTER — Ambulatory Visit: Payer: Medicaid Other | Admitting: Internal Medicine

## 2020-12-15 ENCOUNTER — Encounter: Payer: Self-pay | Admitting: Internal Medicine

## 2020-12-15 VITALS — BP 130/92 | HR 77 | Temp 98.7°F | Ht 68.0 in | Wt 220.7 lb

## 2020-12-15 DIAGNOSIS — R7303 Prediabetes: Secondary | ICD-10-CM | POA: Diagnosis not present

## 2020-12-15 DIAGNOSIS — F331 Major depressive disorder, recurrent, moderate: Secondary | ICD-10-CM | POA: Diagnosis not present

## 2020-12-15 NOTE — Progress Notes (Addendum)
   CC: prediabetes  HPI:  [name] is a 30 y.o. with a past medical history listed below presenting for follow-up of her prediabetes. For details of today's visit and the status of his chronic medical issues please refer to the assessment and plan.   Past Medical History:  Diagnosis Date   Asthma    Review of Systems: Negative except as per assessment and plan  Physical Exam:  Vitals:   12/15/20 0855  BP: (!) 130/92  Pulse: 77  Temp: 98.7 F (37.1 C)  TempSrc: Oral  Weight: 220 lb 11.2 oz (100.1 kg)   Physical Exam General: alert, appears stated age, in no acute distress HEENT: Normocephalic, atraumatic, EOM intact, conjunctiva normal CV: Regular rate and rhythm, no murmurs rubs or gallops Pulm: Clear to auscultation bilaterally, normal work of breathing Abdomen: Soft, nondistended, bowel sounds present, no tenderness to palpation MSK: No lower extremity edema Skin: Warm and dry Neuro: Alert and oriented x3   Assessment & Plan:   See Encounters Tab for problem based charting.  Patient discussed with Dr. Criselda Stone

## 2020-12-15 NOTE — Assessment & Plan Note (Signed)
Patient states that she has stopped taking Lexapro several months ago.  She states that her depression is well controlled at this time.  She has some days that she feels very hopeless but overall has more good days than bad.  She states that she lost her father about 5 years ago and initially really struggled with this.  She did lose a cousin a few months ago and feels that she is still been grieving this loss.  She denies any suicidal thoughts or ideations.  She is not interested in resuming any medications or therapy at this time.

## 2020-12-15 NOTE — Assessment & Plan Note (Signed)
Hemoglobin A1c 6.3.  Patient has multiple family members with diabetes and is nervous about her A1c increasing since about a year ago.  She does not think that she eats poorly but struggles with variations in her meals and finding healthy options.  She is interested in meeting with our dietitian.  Discussed lifestyle modifications and exercise at length.  Also discussed starting metformin however patient would like to adjust her lifestyle before starting any medication.  Provided her information on prediabetes and diet recommendations.  Also referred to [facility] for further recommendations on nutrition and diet.  Plan: -Encourage lifestyle modifications -Follow-up hemoglobin A1c in 6 months -Referral to nutrition and diabetes coordinator Lupita Leash Pyler

## 2020-12-15 NOTE — Patient Instructions (Signed)
Prediabetes Eating Plan ?Prediabetes is a condition that causes blood sugar (glucose) levels to be higher than normal. This increases the risk for developing type 2 diabetes (type 2 diabetes mellitus). Working with a health care provider or nutrition specialist (dietitian) to make diet and lifestyle changes can help prevent the onset of diabetes. These changes may help you: ?Control your blood glucose levels. ?Improve your cholesterol levels. ?Manage your blood pressure. ?What are tips for following this plan? ?Reading food labels ?Read food labels to check the amount of fat, salt (sodium), and sugar in prepackaged foods. Avoid foods that have: ?Saturated fats. ?Trans fats. ?Added sugars. ?Avoid foods that have more than 300 milligrams (mg) of sodium per serving. Limit your sodium intake to less than 2,300 mg each day. ?Shopping ?Avoid buying pre-made and processed foods. ?Avoid buying drinks with added sugar. ?Cooking ?Cook with olive oil. Do not use butter, lard, or ghee. ?Bake, broil, grill, steam, or boil foods. Avoid frying. ?Meal planning ? ?Work with your dietitian to create an eating plan that is right for you. This may include tracking how many calories you take in each day. Use a food diary, notebook, or mobile application to track what you eat at each meal. ?Consider following a Mediterranean diet. This includes: ?Eating several servings of fresh fruits and vegetables each day. ?Eating fish at least twice a week. ?Eating one serving each day of whole grains, beans, nuts, and seeds. ?Using olive oil instead of other fats. ?Limiting alcohol. ?Limiting red meat. ?Using nonfat or low-fat dairy products. ?Consider following a plant-based diet. This includes dietary choices that focus on eating mostly vegetables and fruit, grains, beans, nuts, and seeds. ?If you have high blood pressure, you may need to limit your sodium intake or follow a diet such as the DASH (Dietary Approaches to Stop Hypertension) eating  plan. The DASH diet aims to lower high blood pressure. ?Lifestyle ?Set weight loss goals with help from your health care team. It is recommended that most people with prediabetes lose 7% of their body weight. ?Exercise for at least 30 minutes 5 or more days a week. ?Attend a support group or seek support from a mental health counselor. ?Take over-the-counter and prescription medicines only as told by your health care provider. ?What foods are recommended? ?Fruits ?Berries. Bananas. Apples. Oranges. Grapes. Papaya. Mango. Pomegranate. Kiwi. Grapefruit. Cherries. ?Vegetables ?Lettuce. Spinach. Peas. Beets. Cauliflower. Cabbage. Broccoli. Carrots. Tomatoes. Squash. Eggplant. Herbs. Peppers. Onions. Cucumbers. Brussels sprouts. ?Grains ?Whole grains, such as whole-wheat or whole-grain breads, crackers, cereals, and pasta. Unsweetened oatmeal. Bulgur. Barley. Quinoa. Brown rice. Corn or whole-wheat flour tortillas or taco shells. ?Meats and other proteins ?Seafood. Poultry without skin. Lean cuts of pork and beef. Tofu. Eggs. Nuts. Beans. ?Dairy ?Low-fat or fat-free dairy products, such as yogurt, cottage cheese, and cheese. ?Beverages ?Water. Tea. Coffee. Sugar-free or diet soda. Seltzer water. Low-fat or nonfat milk. Milk alternatives, such as soy or almond milk. ?Fats and oils ?Olive oil. Canola oil. Sunflower oil. Grapeseed oil. Avocado. Walnuts. ?Sweets and desserts ?Sugar-free or low-fat pudding. Sugar-free or low-fat ice cream and other frozen treats. ?Seasonings and condiments ?Herbs. Sodium-free spices. Mustard. Relish. Low-salt, low-sugar ketchup. Low-salt, low-sugar barbecue sauce. Low-fat or fat-free mayonnaise. ?The items listed above may not be a complete list of recommended foods and beverages. Contact a dietitian for more information. ?What foods are not recommended? ?Fruits ?Fruits canned with syrup. ?Vegetables ?Canned vegetables. Frozen vegetables with butter or cream sauce. ?Grains ?Refined white  flour and flour   products, such as bread, pasta, snack foods, and cereals. Meats and other proteins Fatty cuts of meat. Poultry with skin. Breaded or fried meat. Processed meats. Dairy Full-fat yogurt, cheese, or milk. Beverages Sweetened drinks, such as iced tea and soda. Fats and oils Butter. Lard. Ghee. Sweets and desserts Baked goods, such as cake, cupcakes, pastries, cookies, and cheesecake. Seasonings and condiments Spice mixes with added salt. Ketchup. Barbecue sauce. Mayonnaise. The items listed above may not be a complete list of foods and beverages that are not recommended. Contact a dietitian for more information. Where to find more information American Diabetes Association: www.diabetes.org Summary You may need to make diet and lifestyle changes to help prevent the onset of diabetes. These changes can help you control blood sugar, improve cholesterol levels, and manage blood pressure. Set weight loss goals with help from your health care team. It is recommended that most people with prediabetes lose 7% of their body weight. Consider following a Mediterranean diet. This includes eating plenty of fresh fruits and vegetables, whole grains, beans, nuts, seeds, fish, and low-fat dairy, and using olive oil instead of other fats. This information is not intended to replace advice given to you by your health care provider. Make sure you discuss any questions you have with your health care provider. Document Revised: 06/21/2019 Document Reviewed: 06/21/2019 Elsevier Patient Education  2022 Else[employer] Inc.      DASH Eating Plan DASH stands for Dietary Approaches to Stop Hypertension. The DASH eating plan is a healthy eating plan that has been shown to: Reduce high blood pressure (hypertension). Reduce your risk for type 2 diabetes, heart disease, and stroke. Help with weight loss. What are tips for following this plan? Reading food labels Check food labels for the amount of salt  (sodium) per serving. Choose foods with less than 5 percent of the Daily Value of sodium. Generally, foods with less than 300 milligrams (mg) of sodium per serving fit into this eating plan. To find whole grains, look for the word "whole" as the first word in the ingredient list. Shopping Buy products labeled as "low-sodium" or "no salt added." Buy fresh foods. Avoid canned foods and pre-made or frozen meals. Cooking Avoid adding salt when cooking. Use salt-free seasonings or herbs instead of table salt or sea salt. Check with your health care provider or pharmacist before using salt substitutes. Do not fry foods. Cook foods using healthy methods such as baking, boiling, grilling, roasting, and broiling instead. Cook with heart-healthy oils, such as olive, canola, avocado, soybean, or sunflower oil. Meal planning  Eat a balanced diet that includes: 4 or more servings of fruits and 4 or more servings of vegetables each day. Try to fill one-half of your plate with fruits and vegetables. 6-8 servings of whole grains each day. Less than 6 oz (170 g) of lean meat, poultry, or fish each day. A 3-oz (85-g) serving of meat is about the same size as a deck of cards. One egg equals 1 oz (28 g). 2-3 servings of low-fat dairy each day. One serving is 1 cup (237 mL). 1 serving of nuts, seeds, or beans 5 times each week. 2-3 servings of heart-healthy fats. Healthy fats called omega-3 fatty acids are found in foods such as walnuts, flaxseeds, fortified milks, and eggs. These fats are also found in cold-water fish, such as sardines, salmon, and mackerel. Limit how much you eat of: Canned or prepackaged foods. Food that is high in trans fat, such as some fried foods. Food  that is high in saturated fat, such as fatty meat. Desserts and other sweets, sugary drinks, and other foods with added sugar. Full-fat dairy products. Do not salt foods before eating. Do not eat more than 4 egg yolks a week. Try to eat at  least 2 vegetarian meals a week. Eat more home-cooked food and less restaurant, buffet, and fast food. Lifestyle When eating at a restaurant, ask that your food be prepared with less salt or no salt, if possible. If you drink alcohol: Limit how much you use to: 0-1 drink a day for women who are not pregnant. 0-2 drinks a day for men. Be aware of how much alcohol is in your drink. In the [place] one drink equals one 12 oz bottle of beer (355 mL), one 5 oz glass of wine (148 mL), or one 1 oz glass of hard liquor (44 mL). General information Avoid eating more than 2,300 mg of salt a day. If you have hypertension, you may need to reduce your sodium intake to 1,500 mg a day. Work with your health care provider to maintain a healthy body weight or to lose weight. Ask what an ideal weight is for you. Get at least 30 minutes of exercise that causes your heart to beat faster (aerobic exercise) most days of the week. Activities may include walking, swimming, or biking. Work with your health care provider or dietitian to adjust your eating plan to your individual calorie needs. What foods should I eat? Fruits All fresh, dried, or frozen fruit. Canned fruit in natural juice (without added sugar). Vegetables Fresh or frozen vegetables (raw, steamed, roasted, or grilled). Low-sodium or reduced-sodium tomato and vegetable juice. Low-sodium or reduced-sodium tomato sauce and tomato paste. Low-sodium or reduced-sodium canned vegetables. Grains Whole-grain or whole-wheat bread. Whole-grain or whole-wheat pasta. Brown rice. Orpah Cobb. Bulgur. Whole-grain and low-sodium cereals. Pita bread. Low-fat, low-sodium crackers. Whole-wheat flour tortillas. Meats and other proteins Skinless chicken or [place]. Ground chicken or [place]. Pork with fat trimmed off. Fish and seafood. Egg whites. Dried beans, peas, or lentils. Unsalted nuts, nut butters, and seeds. Unsalted canned beans. Lean cuts of beef with fat trimmed  off. Low-sodium, lean precooked or cured meat, such as sausages or meat loaves. Dairy Low-fat (1%) or fat-free (skim) milk. Reduced-fat, low-fat, or fat-free cheeses. Nonfat, low-sodium ricotta or cottage cheese. Low-fat or nonfat yogurt. Low-fat, low-sodium cheese. Fats and oils Soft margarine without trans fats. Vegetable oil. Reduced-fat, low-fat, or light mayonnaise and salad dressings (reduced-sodium). Canola, safflower, olive, avocado, soybean, and sunflower oils. Avocado. Seasonings and condiments Herbs. Spices. Seasoning mixes without salt. Other foods Unsalted popcorn and pretzels. Fat-free sweets. The items listed above may not be a complete list of foods and beverages you can eat. Contact a dietitian for more information. What foods should I avoid? Fruits Canned fruit in a light or heavy syrup. Fried fruit. Fruit in cream or butter sauce. Vegetables Creamed or fried vegetables. Vegetables in a cheese sauce. Regular canned vegetables (not low-sodium or reduced-sodium). Regular canned tomato sauce and paste (not low-sodium or reduced-sodium). Regular tomato and vegetable juice (not low-sodium or reduced-sodium). Rosita Fire. Olives. Grains Baked goods made with fat, such as croissants, muffins, or some breads. Dry pasta or rice meal packs. Meats and other proteins Fatty cuts of meat. Ribs. Fried meat. Tomasa Blase. Bologna, salami, and other precooked or cured meats, such as sausages or meat loaves. Fat from the back of a pig (fatback). Bratwurst. Salted nuts and seeds. Canned beans with added salt. Canned  or smoked fish. Whole eggs or egg yolks. Chicken or [place] with skin. Dairy Whole or 2% milk, cream, and half-and-half. Whole or full-fat cream cheese. Whole-fat or sweetened yogurt. Full-fat cheese. Nondairy creamers. Whipped toppings. Processed cheese and cheese spreads. Fats and oils Butter. Stick margarine. Lard. Shortening. Ghee. Bacon fat. Tropical oils, such as coconut, palm kernel, or  palm oil. Seasonings and condiments Onion salt, garlic salt, seasoned salt, table salt, and sea salt. Worcestershire sauce. Tartar sauce. Barbecue sauce. Teriyaki sauce. Soy sauce, including reduced-sodium. Steak sauce. Canned and packaged gravies. Fish sauce. Oyster sauce. Cocktail sauce. Store-bought horseradish. Ketchup. Mustard. Meat flavorings and tenderizers. Bouillon cubes. Hot sauces. Pre-made or packaged marinades. Pre-made or packaged taco seasonings. Relishes. Regular salad dressings. Other foods Salted popcorn and pretzels. The items listed above may not be a complete list of foods and beverages you should avoid. Contact a dietitian for more information. Where to find more information National Heart, Lung, and Blood Institute: PopSteam.is American Heart Association: www.heart.org Academy of Nutrition and Dietetics: www.eatright.org National Kidney Foundation: www.kidney.org Summary The DASH eating plan is a healthy eating plan that has been shown to reduce high blood pressure (hypertension). It may also reduce your risk for type 2 diabetes, heart disease, and stroke. When on the DASH eating plan, aim to eat more fresh fruits and vegetables, whole grains, lean proteins, low-fat dairy, and heart-healthy fats. With the DASH eating plan, you should limit salt (sodium) intake to 2,300 mg a day. If you have hypertension, you may need to reduce your sodium intake to 1,500 mg a day. Work with your health care provider or dietitian to adjust your eating plan to your individual calorie needs. This information is not intended to replace advice given to you by your health care provider. Make sure you discuss any questions you have with your health care provider. Document Revised: 02/23/2019 Document Reviewed: 02/23/2019 Elsevier Patient Education  2022 [employer].

## 2020-12-17 NOTE — Progress Notes (Signed)
[facility] Attending ° °Case discussed with Dr. Rehman  At the time of the visit.  We reviewed the resident’s history and exam and pertinent patient test results.  I agree with the assessment, diagnosis, and plan of care documented in the resident’s note.  ° °

## 2020-12-18 LAB — CYTOLOGY - PAP
Comment: NEGATIVE
Comment: NEGATIVE
Diagnosis: REACTIVE
HPV 16: NEGATIVE
HPV 18 / 45: NEGATIVE
High risk HPV: POSITIVE — AB

## 2020-12-18 NOTE — Progress Notes (Signed)
Patient is active in MyChart. Message sent regarding abnormal PAP and need to schedule repeat PAP in one year.

## 2021-01-18 ENCOUNTER — Other Ambulatory Visit: Payer: Self-pay

## 2021-01-18 DIAGNOSIS — B369 Superficial mycosis, unspecified: Secondary | ICD-10-CM

## 2021-01-21 ENCOUNTER — Other Ambulatory Visit: Payer: Self-pay | Admitting: Obstetrics

## 2021-01-21 DIAGNOSIS — B369 Superficial mycosis, unspecified: Secondary | ICD-10-CM

## 2021-01-21 MED ORDER — CLOTRIMAZOLE-BETAMETHASONE 1-0.05 % EX CREA: 1.0000 "application " | TOPICAL_CREAM | Freq: Two times a day (BID) | CUTANEOUS | 0 refills | Status: AC

## 2021-04-13 ENCOUNTER — Other Ambulatory Visit: Payer: Self-pay

## 2021-04-13 ENCOUNTER — Other Ambulatory Visit (HOSPITAL_COMMUNITY)
Admission: RE | Admit: 2021-04-13 | Discharge: 2021-04-13 | Disposition: A | Discharge status: Home / Self Care | Payer: Medicaid Other | Source: Ambulatory Visit | Attending: Obstetrics and Gynecology | Admitting: Obstetrics and Gynecology

## 2021-04-13 ENCOUNTER — Ambulatory Visit (INDEPENDENT_AMBULATORY_CARE_PROVIDER_SITE_OTHER): Payer: Medicaid Other | Admitting: *Deleted

## 2021-04-13 VITALS — BP 133/88 | HR 71

## 2021-04-13 DIAGNOSIS — R300 Dysuria: Secondary | ICD-10-CM

## 2021-04-13 DIAGNOSIS — Z711 Person with feared health complaint in whom no diagnosis is made: Secondary | ICD-10-CM | POA: Diagnosis not present

## 2021-04-13 LAB — POCT URINALYSIS DIPSTICK
Glucose, UA: NEGATIVE
Ketones, UA: NEGATIVE
Leukocytes, UA: NEGATIVE
Nitrite, UA: NEGATIVE
Protein, UA: NEGATIVE
Spec Grav, UA: 1.01 (ref 1.010–1.025)
Urobilinogen, UA: 0.2 E.U./dL
pH, UA: 5 (ref 5.0–8.0)

## 2021-04-13 NOTE — Progress Notes (Signed)
SUBJECTIVE:  31 y.o. female complains of clear and thick vaginal discharge for 2 week(s) and dysuria, urinary urgency, and urinary frequency Denies abnormal vaginal bleeding or significant pelvic pain or fever.Denies history of known exposure to STD.  LMP 04/11/21 and 2 weeks prior  OBJECTIVE:  She appears alert, well appearing, in no apparent distress, oriented to person, place and time Urine dipstick: positive for RBC's.  ASSESSMENT:  Vaginal Discharge  Vaginal Odor Dysuria    PLAN:  GC, chlamydia, trichomonas, BVAG, CVAG probe and urine culture sent to lab. STI blood work ordered. Collected by lab. Treatment: To be determined once lab results are received ROV prn if symptoms persist or worsen.

## 2021-04-14 ENCOUNTER — Encounter: Payer: Self-pay | Admitting: Obstetrics

## 2021-04-14 LAB — HEPATITIS C ANTIBODY: Hep C Virus Ab: <0.1 s/co ratio (ref 0.0–0.9)

## 2021-04-14 LAB — CERVICOVAGINAL ANCILLARY ONLY
Bacterial Vaginitis (gardnerella): POSITIVE — AB
Chlamydia: NEGATIVE
Comment: NEGATIVE
Comment: NEGATIVE
Comment: NEGATIVE
Comment: NORMAL
Neisseria Gonorrhea: NEGATIVE
Trichomonas: NEGATIVE

## 2021-04-14 LAB — HIV ANTIBODY (ROUTINE TESTING W REFLEX): HIV Screen 4th Generation wRfx: NONREACTIVE

## 2021-04-14 LAB — HEPATITIS B SURFACE ANTIGEN: Hepatitis B Surface Ag: NEGATIVE

## 2021-04-14 LAB — RPR: RPR Ser Ql: NONREACTIVE

## 2021-04-15 LAB — URINE CULTURE

## 2021-04-16 MED ORDER — METRONIDAZOLE 500 MG PO TABS: 500.0000 mg | ORAL_TABLET | Freq: Two times a day (BID) | ORAL | 0 refills | Status: DC

## 2021-04-16 NOTE — Addendum Note (Signed)
Addended by: Catalina Antigua on: 04/16/2021 11:51 AM   Modules accepted: Orders

## 2021-07-16 ENCOUNTER — Ambulatory Visit: Payer: Medicaid Other | Admitting: Internal Medicine

## 2021-07-16 ENCOUNTER — Encounter: Payer: Self-pay | Admitting: Student

## 2021-07-16 VITALS — BP 130/78 | HR 77 | Temp 98.2°F | Ht 68.0 in | Wt 217.0 lb

## 2021-07-16 DIAGNOSIS — G5601 Carpal tunnel syndrome, right upper limb: Secondary | ICD-10-CM | POA: Diagnosis not present

## 2021-07-16 NOTE — Assessment & Plan Note (Addendum)
Patient has a history of fracturing her right wrist as a child. She states that over the last 10 years, she has been dealing with numbness/tingling in her fingers, as well as intermittent right hand pain. The pain and numbness/tingling has gotten worse over the last 2 weeks, thus prompting the patient to seek further care. 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 Additionally, she is right hand dominant. The numbness specifically is in her first few digits and her pain is limited to her right thenar eminence. The patient also states that because of this pain and her numbness/tingling, she has been dropping things out of her hand more easily. She has been wearing a wrist brace intermittently over the past week, which has been giving her significant relief. She has also been taking ibuprofen, but this has not helped her pain any. She denies any symptoms in her left hand, and also denies any neck pain, back pain, or any other sxs at this time.  ? ?On exam, the patient has a positive Phalen's test, thus, with her history, I suspect that she has carpal tunnel of her right wrist. Discussed this with the patient and recommended that she continue to wear her wrist splint/brace and even recommend that she wear it continuously throughout the day. Will also refer to a hand specialist at this time. ? ?Plan: ?- Referral to hand surgery placed  ?- Recommend continuous use of wrist splint/brace ?

## 2021-07-16 NOTE — Progress Notes (Deleted)
? ?  CC: *** ? ?HPI: ? ?Ms.Auriana JOVANI COLQUHOUN is a 31 y.o.  ? ?Past Medical History:  ?Diagnosis Date  ? Asthma   ? ?Review of Systems:  *** ? ?Physical Exam: ? ?Vitals:  ? 07/16/21 1005  ?BP: 130/78  ?Pulse: 77  ?Temp: 98.2 ?F (36.8 ?C)  ?TempSrc: Oral  ?SpO2: 100%  ?Weight: 217 lb (98.4 kg)  ?Height: 5\' 8"  (1.727 m)  ? ?*** ? ?Assessment & Plan:  ? ?See Encounters Tab for problem based charting. ? ?Patient {GC/GE:3044014::"discussed with","seen with"} Dr. {NAMES:[id number]::"Guilloud","Hoffman","Mullen","Narendra","Williams","Vincent"} ? ?

## 2021-07-16 NOTE — Progress Notes (Signed)
? ?Established Patient Office Visit ? ?Subjective:  ?Patient ID: Hannah Stone, female    DOB: 02/05/1991  Age: 31 y.o. MRN: [id number] ? ?CC:  ?Chief Complaint  ?Patient presents with  ? Hand Pain  ?  RIGHT HAND PAIN (hx of fx  years ago)NOW HAVING NUMBNESS WITH PAIN -SWELLING X 1-2  WEEKS   ? ? ?HPI ?Hannah Stone is a 31 yo female with prediabetes who presents for right hand pain and numbness/tingling. Please see problem-based list for further details, assessments, and plans. ? ? ?Past Medical History:  ?Diagnosis Date  ? Asthma   ? ? ?Past Surgical History:  ?Procedure Laterality Date  ? NO PAST SURGERIES    ? ? ?Family History  ?Problem Relation Age of Onset  ? Hypertension Mother   ? Breast cancer Mother   ? Diabetes Father   ? Hypertension Maternal Grandmother   ? Breast cancer Maternal Aunt   ? ? ?Social History  ? ?Socioeconomic History  ? Marital status: Single  ?  Spouse name: Not on file  ? Number of children: Not on file  ? Years of education: Not on file  ? Highest education level: Not on file  ?Occupational History  ? Not on file  ?Tobacco Use  ? Smoking status: Every Day  ?  Packs/day: 0.50  ?  Types: Cigarettes  ? Smokeless tobacco: Never  ?Vaping Use  ? Vaping Use: Never used  ?Substance and Sexual Activity  ? Alcohol use: Yes  ?  Comment: Occas  ? Drug use: No  ? Sexual activity: Yes  ?  Partners: Male  ?  Birth control/protection: I.U.D.  ?  Comment: 1 week ago  ?Other Topics Concern  ? Not on file  ?Social History Narrative  ? Not on file  ? ?Social Determinants of Health  ? ?Financial Resource Strain: Not on file  ?Food Insecurity: Not on file  ?Transportation Needs: Not on file  ?Physical Activity: Not on file  ?Stress: Not on file  ?Social Connections: Not on file  ?Intimate Partner Violence: Not on file  ? ? ?Outpatient Medications Prior to Visit  ?Medication Sig Dispense Refill  ? clotrimazole-betamethasone (LOTRISONE) cream Apply 1 application topically 2 (two) times  daily. 60 g 0  ? ibuprofen (ADVIL) 800 MG tablet Take 1 tablet (800 mg total) by mouth every 8 (eight) hours as needed. 30 tablet 5  ? levonorgestrel (MIRENA) 20 MCG/24HR IUD 1 each by Intrauterine route once.    ? metroNIDAZOLE (FLAGYL) 500 MG tablet Take 1 tablet (500 mg total) by mouth 2 (two) times daily. 14 tablet 0  ? Prenat-FeCbn-FeAsp-Meth-FA-DHA (PRENATE MINI) 18-0.6-0.4-350 MG CAPS Take 1 capsule by mouth daily before breakfast. 30 capsule 11  ? metroNIDAZOLE (FLAGYL) 500 MG tablet Take 1 tablet (500 mg total) by mouth 2 (two) times daily. 14 tablet 2  ? ?No facility-administered medications prior to visit.  ? ? ?No Known Allergies ? ?ROS ?Review of Systems  ?Constitutional: Negative.   ?HENT: Negative.    ?Respiratory: Negative.    ?Cardiovascular: Negative.   ?Gastrointestinal: Negative.   ?Genitourinary: Negative.   ?Musculoskeletal:  Positive for joint swelling.  ?     Right wrist pain, numbness/tingling  ?Neurological:  Positive for numbness. Negative for dizziness and weakness.  ? ?  ?Objective:  ?  ?General: Pleasant, well-appearing female. No acute distress. ?CV: RRR. No murmurs. ?Pulmonary: Lungs CTAB. Normal effort.  ?Extremities: Palpable radial pulses. Positive Phalen's test on the  right.  ?Skin: Warm and dry. ?Neuro: A&Ox3. Moves all extremities. No focal deficit. ?Psych: Normal mood and affect ? ? ?BP 130/78 (BP Location: Right Arm, Patient Position: Sitting, Cuff Size: Normal)   Pulse 77   Temp 98.2 ?F (36.8 ?C) (Oral)   Ht 5\' 8"  (1.727 m)   Wt 217 lb (98.4 kg)   SpO2 100%   BMI 32.99 kg/m?  ?Wt Readings from Last 3 Encounters:  ?07/16/21 217 lb (98.4 kg)  ?12/15/20 220 lb 11.2 oz (100.1 kg)  ?12/09/20 220 lb 3.2 oz (99.9 kg)  ? ? ? ?Health Maintenance Due  ?Topic Date Due  ? COVID-19 Vaccine (2 - Pfizer series) 11/17/2019  ? ? ?There are no preventive care reminders to display for this patient. ? ?Lab Results  ?Component Value Date  ? TSH 1.520 08/08/2019  ? ?Lab Results  ?Component  Value Date  ? WBC 10.9 (H) 08/08/2019  ? HGB 12.9 08/08/2019  ? HCT 38.6 08/08/2019  ? MCV 90 08/08/2019  ? PLT 254 08/08/2019  ? ?Lab Results  ?Component Value Date  ? NA 138 08/08/2019  ? K 4.2 08/08/2019  ? CO2 21 08/08/2019  ? GLUCOSE 96 08/08/2019  ? BUN 8 08/08/2019  ? CREATININE 0.64 08/08/2019  ? BILITOT 0.2 08/08/2019  ? ALKPHOS 79 08/08/2019  ? AST 15 08/08/2019  ? ALT 14 08/08/2019  ? PROT 7.3 08/08/2019  ? ALBUMIN 4.3 08/08/2019  ? CALCIUM 9.4 08/08/2019  ? ?Lab Results  ?Component Value Date  ? CHOL 154 08/08/2019  ? ?No results found for: HDL ?No results found for: Mignon ?Lab Results  ?Component Value Date  ? TRIG 92 08/08/2019  ? ?No results found for: CHOLHDL ?Lab Results  ?Component Value Date  ? HGBA1C 6.3 (H) 12/09/2020  ? ? ?  ?Assessment & Plan:  ? ?Problem List Items Addressed This Visit   ? ?  ? Nervous and Auditory  ? 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  Patient has a history of fracturing her right wrist as a child. She states that over the last 10 years, she has been dealing with numbness/tingling in her fingers, as well as intermittent right hand pain. The pain and numbness/tingling has gotten worse over the last 2 weeks, thus prompting the patient to seek further care. 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 Additionally, she is right hand dominant. The numbness specifically is in her first few digits and her pain is limited to her right thenar eminence. The patient also states that because of this pain and her numbness/tingling, she has been dropping things out of her hand more easily. She has been wearing a wrist brace intermittently over the past week, which has been giving her significant relief. She has also been taking ibuprofen, but this has not helped her pain any. She denies any symptoms in her left  hand, and also denies any neck pain, back pain, or any other sxs at this time.  ? ?On exam, the patient has a positive Phalen's test, thus, with her history, I suspect that she has carpal tunnel of her right wrist. Discussed this with the patient and recommended that she continue to wear her wrist splint/brace and even recommend that she wear it continuously throughout the day. Will also refer to a hand  specialist at this time. ? ?Plan: ?- Referral to hand surgery placed  ?- Recommend continuous use of wrist splint/brace ?  ?  ? Relevant Orders  ? Ambulatory referral to Hand Surgery  ? ? ?No orders of the defined types were placed in this encounter. ? ? ?Follow-up: Return in about 6 months (around 01/15/2022) for pre-diabetes/routine.  ? ? ?Dorethea Clan, DO ? ?Patient discussed with Dr. Philipp Ovens ?

## 2021-07-16 NOTE — Progress Notes (Signed)
Internal Medicine Clinic Attending ° °Case discussed with Dr. Atway  At the time of the visit.  We reviewed the resident’s history and exam and pertinent patient test results.  I agree with the assessment, diagnosis, and plan of care documented in the resident’s note.  °

## 2021-07-16 NOTE — Patient Instructions (Signed)
Thank you, [name] for allowing [place] to provide your care today. Today we discussed: ? ?Hand pain/numbness and tingling: I suspect that you have carpal tunnel. Continue to wear your wrist brace (you can wear it continuously throughout the day). I am also referring you to a hand specialist today ? ?I have ordered the following labs for you: ? ?Lab Orders  ?No laboratory test(s) ordered today  ?  ? ?Tests ordered today: ? ?none ? ?Referrals ordered today:  ? ? ?Referral Orders    ?     Ambulatory referral to Hand Surgery     ? ?I have ordered the following medication/changed the following medications:  ? ?Stop the following medications: ?There are no discontinued medications.  ? ?Start the following medications: ?No orders of the defined types were placed in this encounter. ?  ? ?Follow up: 1 year routine visit  ? ? ?Remember: To wear your wrist brace continuously, especially while you work ? ?Should you have any questions or concerns please call the internal medicine clinic at [phone].   ? ? ?Elza Rafter, D.O. ?[facility] ? ? ?

## 2021-07-30 ENCOUNTER — Ambulatory Visit: Payer: Medicaid Other | Admitting: Orthopaedic Surgery

## 2021-07-30 ENCOUNTER — Ambulatory Visit (INDEPENDENT_AMBULATORY_CARE_PROVIDER_SITE_OTHER): Payer: Medicaid Other

## 2021-07-30 DIAGNOSIS — G5601 Carpal tunnel syndrome, right upper limb: Secondary | ICD-10-CM | POA: Diagnosis not present

## 2021-07-30 MED ORDER — GABAPENTIN 100 MG PO CAPS: 100.0000 mg | ORAL_CAPSULE | Freq: Every day | ORAL | 3 refills | Status: DC

## 2021-07-30 NOTE — Progress Notes (Signed)
? ?Office Visit Note ?  ?Patient: Hannah Stone           ?Date of Birth: [birth year]           ?MRN: [id number] ?Visit Date: 07/30/2021 ?             ?Requested by: Hannah Poll, MD ?[address] ?[place],  [place] [address] ?PCP: Hannah Bottom, MD ? ? ?Assessment & Plan: ?Visit Diagnoses:  ?1. Right carpal tunnel syndrome   ? ? ?Plan: Impression is right carpal tunnel syndrome.  We will need electrodiagnostic studies to confirm and to assess severity.  Gabapentin prescribed today.  Follow-up after the nerve conduction studies. ? ?Follow-Up Instructions: No follow-ups on file.  ? ?Orders:  ?Orders Placed This Encounter  ?Procedures  ? XR Wrist Complete Right  ? ?Meds ordered this encounter  ?Medications  ? gabapentin (NEURONTIN) 100 MG capsule  ?  Sig: Take 1-3 capsules (100-300 mg total) by mouth at bedtime.  ?  Dispense:  30 capsule  ?  Refill:  3  ? ? ? ? Procedures: ?No procedures performed ? ? ?Clinical Data: ?No additional findings. ? ? ?Subjective: ?Chief Complaint  ?Patient presents with  ? Right Wrist - Carpal Tunnel  ? ? ?HPI ? ?Hannah Stone is a 31 year old female referral from PCP for concerns of right carpal tunnel syndrome.  Onset was a month ago.  History of hand or wrist fracture as a child.  She is right-hand dominant.  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  Has night pain.  Dropping objects occasionally.  Numbness and tingling in all 5 fingers.  Currently wearing a brace which does help.  Pain travels to the elbow at times. ? ?Review of Systems  ?Constitutional: Negative.   ?HENT: Negative.    ?Eyes: Negative.   ?Respiratory: Negative.    ?Cardiovascular: Negative.   ?Endocrine: Negative.   ?Musculoskeletal: Negative.   ?Neurological: Negative.   ?Hematological: Negative.   ?Psychiatric/Behavioral: Negative.    ?All other systems reviewed and are negative. ? ? ?Objective: ?Vital Signs: There were no vitals taken for this visit. ? ?Physical Exam ?Vitals and nursing note reviewed.   ?Constitutional:   ?   Appearance: She is well-developed.  ?HENT:  ?   Head: Normocephalic and atraumatic.  ?Pulmonary:  ?   Effort: Pulmonary effort is normal.  ?Abdominal:  ?   Palpations: Abdomen is soft.  ?Musculoskeletal:  ?   Cervical back: Neck supple.  ?Skin: ?   General: Skin is warm.  ?   Capillary Refill: Capillary refill takes less than 2 seconds.  ?Neurological:  ?   Mental Status: She is alert and oriented to person, place, and time.  ?Psychiatric:     ?   Behavior: Behavior normal.     ?   Thought Content: Thought content normal.     ?   Judgment: Judgment normal.  ? ? ?Ortho Exam ? ?Examination of the right wrist shows positive carpal tunnel compressive signs.  Normal range of motion.  Able to make a full composite fist.  Normal capillary refill. ? ?Specialty Comments:  ?No specialty comments available. ? ?Imaging: ?XR Wrist Complete Right ? ?Result Date: 07/30/2021 ?No acute or structural abnormalities  ? ? ?PMFS History: ?Patient Active Problem List  ? Diagnosis Date Noted  ? Right carpal tunnel syndrome 07/30/2021  ? Carpal tunnel syndrome of right wrist 07/16/2021  ? Moderate episode of recurrent major depressive disorder (HCC) 08/15/2019  ? Pre-diabetes  08/15/2019  ? Tobacco use 08/15/2019  ? Furuncle of cheek 08/15/2019  ? BMI 31.0-31.9,adult 10/16/2015  ? History of preterm delivery 10/14/2015  ? ?Past Medical History:  ?Diagnosis Date  ? Asthma   ?  ?Family History  ?Problem Relation Age of Onset  ? Hypertension Mother   ? Breast cancer Mother   ? Diabetes Father   ? Hypertension Maternal Grandmother   ? Breast cancer Maternal Aunt   ?  ?Past Surgical History:  ?Procedure Laterality Date  ? NO PAST SURGERIES    ? ?Social History  ? ?Occupational History  ? Not on file  ?Tobacco Use  ? Smoking status: Every Day  ?  Packs/day: 0.50  ?  Types: Cigarettes  ? Smokeless tobacco: Never  ?Vaping Use  ? Vaping Use: Never used  ?Substance and Sexual Activity  ? Alcohol use: Yes  ?  Comment: Occas  ?  Drug use: No  ? Sexual activity: Yes  ?  Partners: Male  ?  Birth control/protection: I.U.D.  ?  Comment: 1 week ago  ? ? ? ? ? ? ?

## 2021-07-30 NOTE — Addendum Note (Signed)
Addended by: Albertina Parr on: 07/30/2021 11:03 AM ? ? Modules accepted: Orders ? ?

## 2021-08-12 ENCOUNTER — Telehealth: Payer: Self-pay | Admitting: Physical Medicine and Rehabilitation

## 2021-08-12 NOTE — Telephone Encounter (Signed)
Patient returned call asked for a call back.    [phone] ?

## 2021-08-12 NOTE — Telephone Encounter (Signed)
Pt called requesting a call back for nerve study referral. Please call pt at [phone] ?

## 2021-08-12 NOTE — Telephone Encounter (Signed)
IC appt scheduled.  

## 2021-08-12 NOTE — Telephone Encounter (Signed)
Tried calling back to schedule LMVM  ?

## 2021-09-09 ENCOUNTER — Ambulatory Visit (INDEPENDENT_AMBULATORY_CARE_PROVIDER_SITE_OTHER): Payer: Medicaid Other | Admitting: Physical Medicine and Rehabilitation

## 2021-09-09 ENCOUNTER — Encounter: Payer: Self-pay | Admitting: Physical Medicine and Rehabilitation

## 2021-09-09 DIAGNOSIS — R202 Paresthesia of skin: Secondary | ICD-10-CM

## 2021-09-09 NOTE — Progress Notes (Signed)
Pt state she has pain in  her right hand. Pt state the pain is in the palm, thumb and index finger. Pt state she has hx of broken hand. Pt satte it hard for her to cook or do her hair. Pt state she takes over the counter pain meds and uses a braces at night. Pt state she is right handed.  Numeric Pain Rating Scale and Functional Assessment Average Pain 5   In the last MONTH (on 0-10 scale) has pain interfered with the following?  1. General activity like being  able to carry out your everyday physical activities such as walking, climbing stairs, carrying groceries, or moving a chair?  Rating(10)    -BT, -Dye Allergies.

## 2021-09-15 NOTE — Progress Notes (Signed)
Hannah Stone - 31 y.o. female MRN [id number]  Date of birth: 05-04-1990  Office Visit Note: Visit Date: 09/09/2021 PCP: Eliezer BottomAslam, Sadia, MD Referred by: Tarry KosXu, Naiping M, MD  Subjective: Chief Complaint  Patient presents with   Right Hand - Pain   HPI:  Hannah Stone is a 31 y.o. female who comes in today at the request of Dr. Glee ArvinMichael Xu for electrodiagnostic study of the Right upper extremities.  Patient is Right hand dominant.  She reports about 2 months of worsening right hand pain with numbness and tingling.  Some nocturnal complaints.  She does work as a Scientist, research (medical)hairstylist and gets difficulty with doing that and some increased symptoms with movement of the hand in general.  She endorsed dropping objects at times.  She denies any left-sided symptoms.  No frank radicular symptoms.  No history of diabetes.  No history of prior electrodiagnostic studies.   ROS Otherwise per HPI.  Assessment & Plan: Visit Diagnoses:    ICD-10-CM   1. Paresthesia of skin  R20.2 NCV with EMG (electromyography)      Plan: Impression: Essentially NORMAL electrodiagnostic study of the right upper limb.  There is no significant electrodiagnostic evidence of nerve entrapment, brachial plexopathy or cervical radiculopathy.    As you know, purely sensory or demyelinating radiculopathies and chemical radiculitis may not be detected with this particular electrodiagnostic study.  Recommendations: 1.  Follow-up with referring physician. 2.  Continue current management of symptoms. 3.  Continue use of resting splint at night-time and as needed during the day.  Meds & Orders: No orders of the defined types were placed in this encounter.   Orders Placed This Encounter  Procedures   NCV with EMG (electromyography)    Follow-up: Return in about 2 weeks (around 09/23/2021) for Glee ArvinMichael Xu, MD.   Procedures: No procedures performed  EMG & NCV Findings: Evaluation of the right median (across palm)  sensory nerve showed no response (Palm).  All remaining nerves (as indicated in the following tables) were within normal limits.    All examined muscles (as indicated in the following table) showed no evidence of electrical instability.    Impression: Essentially NORMAL electrodiagnostic study of the right upper limb.  There is no significant electrodiagnostic evidence of nerve entrapment, brachial plexopathy or cervical radiculopathy.    As you know, purely sensory or demyelinating radiculopathies and chemical radiculitis may not be detected with this particular electrodiagnostic study.  Recommendations: 1.  Follow-up with referring physician. 2.  Continue current management of symptoms. 3.  Continue use of resting splint at night-time and as needed during the day.  ___________________________ Elease HashimotoFred Alesia Oshields FAAPMR Board Certified, American Board of Physical Medicine and Rehabilitation    Nerve Conduction Studies Anti Sensory Summary Table   Stim Site NR Peak (ms) Norm Peak (ms) P-T Amp (V) Norm P-T Amp Site1 Site2 Delta-P (ms) Dist (cm) Vel (m/s) Norm Vel (m/s)  Right Median Acr Palm Anti Sensory (2nd Digit)  30.6C  Wrist    3.3 <3.6 54.3 >10 Wrist Palm  0.0    Palm *NR  <2.0          Right Radial Anti Sensory (Base 1st Digit)  30.7C  Wrist    2.1 <3.1 37.7  Wrist Base 1st Digit 2.1 0.0    Right Ulnar Anti Sensory (5th Digit)  30.9C  Wrist    3.4 <3.7 53.8 >15.0 Wrist 5th Digit 3.4 14.0 41 >38   Motor Summary Table  Stim Site NR Onset (ms) Norm Onset (ms) O-P Amp (mV) Norm O-P Amp Site1 Site2 Delta-0 (ms) Dist (cm) Vel (m/s) Norm Vel (m/s)  Right Median Motor (Abd Poll Brev)  30.9C  Wrist    4.0 <4.2 7.8 >5 Elbow Wrist 4.5 23.0 51 >50  Elbow    8.5  7.4         Right Ulnar Motor (Abd Dig Min)  30.9C  Wrist    2.7 <4.2 9.4 >3 B Elbow Wrist 4.2 24.0 57 >53  B Elbow    6.9  8.0  A Elbow B Elbow 1.1 10.5 95 >53  A Elbow    8.0  7.8          EMG   Side Muscle Nerve Root  Ins Act Fibs Psw Amp Dur Poly Recrt Int Fraser Din Comment  Right Abd Poll Brev Median C8-T1 Nml Nml Nml Nml Nml 0 Nml Nml   Right 1stDorInt Ulnar C8-T1 Nml Nml Nml Nml Nml 0 Nml Nml   Right PronatorTeres Median C6-7 Nml Nml Nml Nml Nml 0 Nml Nml   Right Biceps Musculocut C5-6 Nml Nml Nml Nml Nml 0 Nml Nml   Right Deltoid Axillary C5-6 Nml Nml Nml Nml Nml 0 Nml Nml     Nerve Conduction Studies Anti Sensory Left/Right Comparison   Stim Site L Lat (ms) R Lat (ms) L-R Lat (ms) L Amp (V) R Amp (V) L-R Amp (%) Site1 Site2 L Vel (m/s) R Vel (m/s) L-R Vel (m/s)  Median Acr Palm Anti Sensory (2nd Digit)  30.6C  Wrist  3.3   54.3  Wrist Palm     Palm             Radial Anti Sensory (Base 1st Digit)  30.7C  Wrist  2.1   37.7  Wrist Base 1st Digit     Ulnar Anti Sensory (5th Digit)  30.9C  Wrist  3.4   53.8  Wrist 5th Digit  41    Motor Left/Right Comparison   Stim Site L Lat (ms) R Lat (ms) L-R Lat (ms) L Amp (mV) R Amp (mV) L-R Amp (%) Site1 Site2 L Vel (m/s) R Vel (m/s) L-R Vel (m/s)  Median Motor (Abd Poll Brev)  30.9C  Wrist  4.0   7.8  Elbow Wrist  51   Elbow  8.5   7.4        Ulnar Motor (Abd Dig Min)  30.9C  Wrist  2.7   9.4  B Elbow Wrist  57   B Elbow  6.9   8.0  A Elbow B Elbow  95   A Elbow  8.0   7.8           Waveforms:             Clinical History: No specialty comments available.     Objective:  VS:  HT:    WT:   BMI:     BP:   HR: bpm  TEMP: ( )  RESP:  Physical Exam Musculoskeletal:        General: No swelling, tenderness or deformity.     Comments: Inspection reveals no atrophy of the bilateral APB or FDI or hand intrinsics. There is no swelling, color changes, allodynia or dystrophic changes. There is 5 out of 5 strength in the bilateral wrist extension, finger abduction and long finger flexion. There is intact sensation to light touch in all dermatomal and peripheral nerve distributions. There is a negative  Hoffmann's test bilaterally.  Skin:     General: Skin is warm and dry.     Findings: No erythema or rash.  Neurological:     General: No focal deficit present.     Mental Status: She is alert and oriented to person, place, and time.     Motor: No weakness or abnormal muscle tone.     Coordination: Coordination normal.  Psychiatric:        Mood and Affect: Mood normal.        Behavior: Behavior normal.      Imaging: No results found.

## 2021-09-15 NOTE — Procedures (Signed)
EMG & NCV Findings: Evaluation of the right median (across palm) sensory nerve showed no response (Palm).  All remaining nerves (as indicated in the following tables) were within normal limits.    All examined muscles (as indicated in the following table) showed no evidence of electrical instability.    Impression: Essentially NORMAL electrodiagnostic study of the right upper limb.  There is no significant electrodiagnostic evidence of nerve entrapment, brachial plexopathy or cervical radiculopathy.    As you know, purely sensory or demyelinating radiculopathies and chemical radiculitis may not be detected with this particular electrodiagnostic study.  Recommendations: 1.  Follow-up with referring physician. 2.  Continue current management of symptoms. 3.  Continue use of resting splint at night-time and as needed during the day.  ___________________________ Elease Hashimoto Board Certified, American Board of Physical Medicine and Rehabilitation    Nerve Conduction Studies Anti Sensory Summary Table   Stim Site NR Peak (ms) Norm Peak (ms) P-T Amp (V) Norm P-T Amp Site1 Site2 Delta-P (ms) Dist (cm) Vel (m/s) Norm Vel (m/s)  Right Median Acr Palm Anti Sensory (2nd Digit)  30.6C  Wrist    3.3 <3.6 54.3 >10 Wrist Palm  0.0    Palm *NR  <2.0          Right Radial Anti Sensory (Base 1st Digit)  30.7C  Wrist    2.1 <3.1 37.7  Wrist Base 1st Digit 2.1 0.0    Right Ulnar Anti Sensory (5th Digit)  30.9C  Wrist    3.4 <3.7 53.8 >15.0 Wrist 5th Digit 3.4 14.0 41 >38   Motor Summary Table   Stim Site NR Onset (ms) Norm Onset (ms) O-P Amp (mV) Norm O-P Amp Site1 Site2 Delta-0 (ms) Dist (cm) Vel (m/s) Norm Vel (m/s)  Right Median Motor (Abd Poll Brev)  30.9C  Wrist    4.0 <4.2 7.8 >5 Elbow Wrist 4.5 23.0 51 >50  Elbow    8.5  7.4         Right Ulnar Motor (Abd Dig Min)  30.9C  Wrist    2.7 <4.2 9.4 >3 B Elbow Wrist 4.2 24.0 57 >53  B Elbow    6.9  8.0  A Elbow B Elbow 1.1 10.5 95  >53  A Elbow    8.0  7.8          EMG   Side Muscle Nerve Root Ins Act Fibs Psw Amp Dur Poly Recrt Int Dennie Bible Comment  Right Abd Poll Brev Median C8-T1 Nml Nml Nml Nml Nml 0 Nml Nml   Right 1stDorInt Ulnar C8-T1 Nml Nml Nml Nml Nml 0 Nml Nml   Right PronatorTeres Median C6-7 Nml Nml Nml Nml Nml 0 Nml Nml   Right Biceps Musculocut C5-6 Nml Nml Nml Nml Nml 0 Nml Nml   Right Deltoid Axillary C5-6 Nml Nml Nml Nml Nml 0 Nml Nml     Nerve Conduction Studies Anti Sensory Left/Right Comparison   Stim Site L Lat (ms) R Lat (ms) L-R Lat (ms) L Amp (V) R Amp (V) L-R Amp (%) Site1 Site2 L Vel (m/s) R Vel (m/s) L-R Vel (m/s)  Median Acr Palm Anti Sensory (2nd Digit)  30.6C  Wrist  3.3   54.3  Wrist Palm     Palm             Radial Anti Sensory (Base 1st Digit)  30.7C  Wrist  2.1   37.7  Wrist Base 1st Digit     Ulnar  Anti Sensory (5th Digit)  30.9C  Wrist  3.4   53.8  Wrist 5th Digit  41    Motor Left/Right Comparison   Stim Site L Lat (ms) R Lat (ms) L-R Lat (ms) L Amp (mV) R Amp (mV) L-R Amp (%) Site1 Site2 L Vel (m/s) R Vel (m/s) L-R Vel (m/s)  Median Motor (Abd Poll Brev)  30.9C  Wrist  4.0   7.8  Elbow Wrist  51   Elbow  8.5   7.4        Ulnar Motor (Abd Dig Min)  30.9C  Wrist  2.7   9.4  B Elbow Wrist  57   B Elbow  6.9   8.0  A Elbow B Elbow  95   A Elbow  8.0   7.8           Waveforms:

## 2023-01-06 ENCOUNTER — Encounter: Payer: Medicaid Other | Admitting: Student

## 2023-11-04 ENCOUNTER — Encounter (HOSPITAL_BASED_OUTPATIENT_CLINIC_OR_DEPARTMENT_OTHER): Payer: Self-pay

## 2023-11-04 ENCOUNTER — Other Ambulatory Visit: Payer: Self-pay

## 2023-11-04 ENCOUNTER — Emergency Department (HOSPITAL_BASED_OUTPATIENT_CLINIC_OR_DEPARTMENT_OTHER)
Admission: EM | Admit: 2023-11-04 | Discharge: 2023-11-04 | Disposition: A | Discharge status: Home / Self Care | Attending: Emergency Medicine | Admitting: Emergency Medicine

## 2023-11-04 DIAGNOSIS — R42 Dizziness and giddiness: Secondary | ICD-10-CM | POA: Diagnosis present

## 2023-11-04 DIAGNOSIS — H8110 Benign paroxysmal vertigo, unspecified ear: Secondary | ICD-10-CM | POA: Diagnosis not present

## 2023-11-04 DIAGNOSIS — R7309 Other abnormal glucose: Secondary | ICD-10-CM | POA: Insufficient documentation

## 2023-11-04 DIAGNOSIS — R002 Palpitations: Secondary | ICD-10-CM | POA: Diagnosis not present

## 2023-11-04 LAB — CBC WITH DIFFERENTIAL/PLATELET
Abs Immature Granulocytes: 0.03 K/uL (ref 0.00–0.07)
Basophils Absolute: 0 K/uL (ref 0.0–0.1)
Basophils Relative: 0 %
Eosinophils Absolute: 0 K/uL (ref 0.0–0.5)
Eosinophils Relative: 0 %
HCT: 39.1 % (ref 36.0–46.0)
Hemoglobin: 13.2 g/dL (ref 12.0–15.0)
Immature Granulocytes: 0 %
Lymphocytes Relative: 17 %
Lymphs Abs: 1.6 K/uL (ref 0.7–4.0)
MCH: 30.3 pg (ref 26.0–34.0)
MCHC: 33.8 g/dL (ref 30.0–36.0)
MCV: 89.9 fL (ref 80.0–100.0)
Monocytes Absolute: 0.3 K/uL (ref 0.1–1.0)
Monocytes Relative: 3 %
Neutro Abs: 7.5 K/uL (ref 1.7–7.7)
Neutrophils Relative %: 80 %
Platelets: 240 K/uL (ref 150–400)
RBC: 4.35 MIL/uL (ref 3.87–5.11)
RDW: 12.2 % (ref 11.5–15.5)
WBC: 9.5 K/uL (ref 4.0–10.5)
nRBC: 0 % (ref 0.0–0.2)

## 2023-11-04 LAB — COMPREHENSIVE METABOLIC PANEL WITH GFR
ALT: 14 U/L (ref 0–44)
AST: 20 U/L (ref 15–41)
Albumin: 4 g/dL (ref 3.5–5.0)
Alkaline Phosphatase: 60 U/L (ref 38–126)
Anion gap: 12 (ref 5–15)
BUN: 8 mg/dL (ref 6–20)
CO2: 22 mmol/L (ref 22–32)
Calcium: 9.3 mg/dL (ref 8.9–10.3)
Chloride: 105 mmol/L (ref 98–111)
Creatinine, Ser: 0.68 mg/dL (ref 0.44–1.00)
GFR, Estimated: >60 mL/min (ref >60)
Glucose, Bld: 144 mg/dL — ABNORMAL HIGH (ref 70–99)
Potassium: 4 mmol/L (ref 3.5–5.1)
Sodium: 139 mmol/L (ref 135–145)
Total Bilirubin: 0.3 mg/dL (ref 0.0–1.2)
Total Protein: 6.7 g/dL (ref 6.5–8.1)

## 2023-11-04 LAB — CBG MONITORING, ED: Glucose-Capillary: 167 mg/dL — ABNORMAL HIGH (ref 70–99)

## 2023-11-04 LAB — TROPONIN T, HIGH SENSITIVITY: Troponin T High Sensitivity: <15 ng/L (ref <19)

## 2023-11-04 LAB — HCG, SERUM, QUALITATIVE: Preg, Serum: NEGATIVE

## 2023-11-04 LAB — TSH: TSH: 0.883 u[IU]/mL (ref 0.350–4.500)

## 2023-11-04 MED ORDER — MECLIZINE HCL 25 MG PO TABS: 25.0000 mg | ORAL_TABLET | Freq: Four times a day (QID) | ORAL | 0 refills | Status: AC | PRN

## 2023-11-04 MED ORDER — HYDROXYZINE HCL 25 MG PO TABS
50.0000 mg | ORAL_TABLET | Freq: Once | ORAL | Status: AC
  Administered 2023-11-04: 50 mg via ORAL
  Filled 2023-11-04: qty 2

## 2023-11-04 MED ORDER — LACTATED RINGERS IV BOLUS
1000.0000 mL | Freq: Once | INTRAVENOUS | Status: AC
  Administered 2023-11-04: 1000 mL via INTRAVENOUS

## 2023-11-04 NOTE — ED Triage Notes (Signed)
 Pt advises woke up dizzy, experiencing since yesterday intermittently, worse w movement. Feels like heart's been skipping a beat for the last week.   Advises she vomited this AM, I only ate an apple.

## 2023-11-04 NOTE — Discharge Instructions (Addendum)
 Your dizziness seems consistent with vertigo. With benign positional vertigo, calcium crystals in the inner ear break free and disturb the inner ear area. This causes you to feel dizzy when moving.  You have been prescribed a medication called meclizine.  You may take 25 mg every 6 hours as needed for dizziness.  This is also over-the-counter.  If your dizziness is not improving with medication, please make an appointment with physical therapy.  You can self refer or get a referral from your PCP.  Your blood counts, electrolytes, kidney, and liver tests were normal today.  Your thyroid function is normal.  Your pregnancy test is negative.  Your EKG which is your heart's rhythm was normal.  Your troponin which is a heart test was normal.  This to be elevated if you were having a heart attack.  Please follow-up with your PCP within the next week for recheck of symptoms  Please return to the ER for worsening dizziness, severe headaches, vision changes, any other new or concerning symptoms

## 2023-11-04 NOTE — ED Provider Notes (Signed)
 [name] AT [facility] Provider Note   CSN: [id number] Arrival date & time: 11/04/23  1100     Patient presents with: Dizziness and Nausea   Hannah Stone is a 33 y.o. female no significant past medical history presents with concern for dizziness over the past 2 days.  Reports she has had a lightheaded sensation most the time, but it worsens with movement such as turning her head.  Denies any syncopal episodes, double vision, headaches.  She also reports feeling some heart palpitations over the past 2 weeks.  Denies any chest pain or shortness of breath.  Reports she has been out in the heat, and wonders if this may be related to her symptoms today.    Dizziness      Prior to Admission medications   Medication Sig Start Date End Date Taking? Authorizing Provider  meclizine (ANTIVERT) 25 MG tablet Take 1 tablet (25 mg total) by mouth every 6 (six) hours as needed for dizziness. 11/04/23  Yes Veta Palma, PA-C  clotrimazole -betamethasone  (LOTRISONE ) cream Apply 1 application topically 2 (two) times daily. 01/21/21   Rudy Carlin LABOR, MD  gabapentin  (NEURONTIN ) 100 MG capsule Take 1-3 capsules (100-300 mg total) by mouth at bedtime. 07/30/21   Jerri Kay HERO, MD  ibuprofen  (ADVIL ) 800 MG tablet Take 1 tablet (800 mg total) by mouth every 8 (eight) hours as needed. 12/09/20   Rudy Carlin LABOR, MD  levonorgestrel  (MIRENA ) 20 MCG/24HR IUD 1 each by Intrauterine route once.    [provider]  metroNIDAZOLE  (FLAGYL ) 500 MG tablet Take 1 tablet (500 mg total) by mouth 2 (two) times daily. 04/16/21   Constant, Peggy, MD  Prenat-FeCbn-FeAsp-Meth-FA-DHA (PRENATE MINI ) 18-0.6-0.4-350 MG CAPS Take 1 capsule by mouth daily before breakfast. 12/09/20   Rudy Carlin LABOR, MD    Allergies: Patient has no known allergies.    Review of Systems  Neurological:  Positive for dizziness.    Updated Vital Signs BP 111/72   Pulse 63   Temp 98.1 F (36.7  C)   Resp 10   LMP 10/18/2023 (Approximate)   SpO2 100%   Physical Exam Vitals and nursing note reviewed.  Constitutional:      General: She is not in acute distress.    Appearance: She is well-developed.  HENT:     Head: Normocephalic and atraumatic.  Eyes:     Conjunctiva/sclera: Conjunctivae normal.  Cardiovascular:     Rate and Rhythm: Normal rate and regular rhythm.     Heart sounds: No murmur heard.    Comments: Radial and dorsalis pedis pulse 2+ bilaterally Pulmonary:     Effort: Pulmonary effort is normal. No respiratory distress.     Breath sounds: Normal breath sounds.  Abdominal:     Palpations: Abdomen is soft.     Tenderness: There is no abdominal tenderness.  Musculoskeletal:        General: No swelling.     Cervical back: Neck supple.     Right lower leg: No edema.     Left lower leg: No edema.     Comments: No calf tenderness to palpation bilaterally  Skin:    General: Skin is warm and dry.     Capillary Refill: Capillary refill takes less than 2 seconds.  Neurological:     Mental Status: She is alert.     Comments: Mental status: Alert and oriented to self, place, and month  Speech: Answers questions appropriately  Cranial Nerves: III, IV,  VI: EOM intact, Pupils equal round and reactive, no gaze preference or deviation, no nystagmus. V: normal sensation in V1, V2, and V3 segments bilaterally VII: smiles, puffs cheeks, raises eyebrows, and closes eyes without asymmetry.  VIII: normal hearing to speech IX, X: normal palatal elevation, no uvular deviation XI: 5/5 head turn and 5/5 shoulder shrug bilaterally XII: midline tongue protrusion  Motor: 5/5 strength in right shoulder abductors/adductors, elbow flexors/extensors, wrist flexors/extensors, finger abductors/adductors.  5/5 in left hipflexors/extensors, knee flexors/extensors, ankle dorsiflexion and plantarflexion  5/5 strength in left shoulder abductors/adductors, elbow flexors/extensors, wrist  flexors/extensors, finger abductors/adductors.  5/5 in left hipflexors/extensors, knee flexors/extensors, ankle dorsiflexion and plantarflexion   Sensory: Intact sensation in upper and lower extremity bilaterally    Coordination: Normal finger to nose and heel to shin, no tremor, no dysmetria  Gait: Normal; patient able to tip-toe, heel-walk.   Psychiatric:        Mood and Affect: Mood normal.     (all labs ordered are listed, but only abnormal results are displayed) Labs Reviewed  COMPREHENSIVE METABOLIC PANEL WITH GFR - Abnormal; Notable for the following components:      Result Value   Glucose, Bld 144 (*)    All other components within normal limits  CBG MONITORING, ED - Abnormal; Notable for the following components:   Glucose-Capillary 167 (*)    All other components within normal limits  CBC WITH DIFFERENTIAL/PLATELET  TSH  HCG, SERUM, QUALITATIVE  TROPONIN T, HIGH SENSITIVITY    EKG: EKG Interpretation Date/Time:  Friday November 04 2023 11:13:12 EDT Ventricular Rate:  73 PR Interval:  141 QRS Duration:  78 QT Interval:  376 QTC Calculation: 415 R Axis:   23  Text Interpretation: Sinus rhythm Low voltage, precordial leads Confirmed by Cottie Cough [phone]) on 11/04/2023 11:20:40 AM  Radiology: No results found.   Procedures   Medications Ordered in the ED  hydrOXYzine (ATARAX) tablet 50 mg (50 mg Oral Given 11/04/23 1243)  lactated ringers  bolus 1,000 mL (0 mLs Intravenous Stopped 11/04/23 1347)                                    Medical Decision Making Amount and/or Complexity of Data Reviewed Labs: ordered.  Risk Prescription drug management.     Differential diagnosis includes but is not limited to benign paroxysmal positional vertigo, Mnire's disease, vestibular neuritis, dehydration, electrolyte abnormality, ACS, CVA  ED Course:  Upon initial evaluation, patient is well-appearing, no acute distress.  Stable vitals.  No neurologic  deficits on exam.  Reports she is feeling dizzy when she moves her head.  Labs Ordered: I Ordered, and personally interpreted labs.  The pertinent results include:   CBC within normal limits CMP with mildly elevated glucose at 144, otherwise within normal limits TSH within normal limits Pregnancy negative Troponin under 15   Cardiac Monitoring: / EKG: The patient was maintained on a cardiac monitor.  I personally viewed and interpreted the cardiac monitored which showed an underlying rhythm of: Normal sinus rhythm    Medications Given: Hydroxyzine LR bolus  Upon re-evaluation, patient reports she is feeling somewhat better with the medication and fluids.  She did still feel an episode of dizziness when sitting up in the bed.  No dizziness when lying still.  Given dizziness occurs with changes in position, normal neurologic exam, low concern for posterior stroke.  Her labs are reassuring with normal electrolytes,  blood counts, TSH.  EKG without any signs of arrhythmia.  Exam and systems consistent with benign positional paroxysmal vertigo.  Discussed that she can continue taking the meclizine at home.  Also recommended physical therapy for vestibular rehab.   Stable and appropriate for discharge home  Impression: Benign paroxysmal positional vertigo  Disposition:  The patient was discharged home with instructions to take meclizine as prescribed for dizziness.  Make an appointment with a physical therapy office provided for treatment of her vertigo.  Follow-up with PCP within the next week for recheck of symptoms. Return precautions given.    This chart was dictated using voice recognition software, Dragon. Despite the best efforts of this provider to proofread and correct errors, errors may still occur which can change documentation meaning.       Final diagnoses:  Benign paroxysmal positional vertigo, unspecified laterality    ED Discharge Orders          Ordered     meclizine (ANTIVERT) 25 MG tablet  Every 6 hours PRN        11/04/23 1429               Veta Palma, PA-C 11/04/23 1433    Cottie Donnice PARAS, MD 11/04/23 1527

## 2023-11-07 ENCOUNTER — Ambulatory Visit

## 2023-11-07 ENCOUNTER — Ambulatory Visit: Payer: Self-pay

## 2023-11-07 ENCOUNTER — Other Ambulatory Visit: Payer: Self-pay

## 2023-11-07 VITALS — BP 113/60 | HR 77 | Temp 98.8°F | Ht 69.0 in | Wt 217.0 lb

## 2023-11-07 DIAGNOSIS — Z0000 Encounter for general adult medical examination without abnormal findings: Secondary | ICD-10-CM

## 2023-11-07 DIAGNOSIS — R7303 Prediabetes: Secondary | ICD-10-CM

## 2023-11-07 DIAGNOSIS — H8111 Benign paroxysmal vertigo, right ear: Secondary | ICD-10-CM

## 2023-11-07 LAB — GLUCOSE, CAPILLARY: Glucose-Capillary: 104 mg/dL — ABNORMAL HIGH (ref 70–99)

## 2023-11-07 LAB — POCT GLYCOSYLATED HEMOGLOBIN (HGB A1C): Hemoglobin A1C: 6.2 % — AB (ref 4.0–5.6)

## 2023-11-07 NOTE — Progress Notes (Signed)
 CC: Acute Concern of Dizziness  HPI:  Hannah Stone is a 33 y.o. female with pertinent PMH of pre-diabetes who presents for positional dizziness.  She was seen in the ED on 8/1 for concerns of dizziness and shortness of breath, diagnosed with benign positional paroxysmal vertigo and prescribed Meclizine . She was advised to make an appointment with a physical therapist at that time.  Her symptoms are associated mostly with changes in position.  She feels like the room is spinning around her.  The episodes last for 2 to 3 minutes.  The worst position is when she bends over.  Her symptoms are associated with movements of her head as well.  She has been taking meclizine  with some improvement of her symptoms.   She has had episodic headaches that are left-sided, with left-sided pressure and some throbbing.  They normally get better on their own.  She endorses some mild ringing in her ears and left-sided ear pain.  She also has some congestion and pain with yawning.  She also notes that she lost feeling in her left great toe 3 months ago.  She has also been drinking more water than usual and peeing more than usual.    Review of Systems  HENT:  Positive for congestion, ear pain and tinnitus. Negative for ear discharge and hearing loss.   Eyes:  Negative for blurred vision and pain.  Gastrointestinal:  Positive for nausea and vomiting.  Neurological:  Positive for dizziness, sensory change and headaches. Negative for focal weakness.    Medications: Current Outpatient Medications  Medication Instructions   clotrimazole -betamethasone  (LOTRISONE ) cream 1 application , Topical, 2 times daily   levonorgestrel  (MIRENA ) 20 MCG/24HR IUD 1 each, Intrauterine,  Once   meclizine  (ANTIVERT ) 25 mg, Oral, Every 6 hours PRN   Prenat-FeCbn-FeAsp-Meth-FA-DHA (PRENATE MINI ) 18-0.6-0.4-350 MG CAPS 1 capsule, Oral, Daily before breakfast     Physical Exam:  Vitals:   11/07/23 1331  BP: 113/60   Pulse: 77  Temp: 98.8 F (37.1 C)  TempSrc: Oral  SpO2: 100%  Weight: 217 lb (98.4 kg)  Height: 5' 9 (1.753 m)    Physical Exam Constitutional:      General: She is not in acute distress.    Appearance: She is not ill-appearing.  HENT:     Right Ear: Ear canal normal. No decreased hearing noted. No drainage.     Left Ear: Ear canal normal. No decreased hearing noted. No drainage.     Ears:     Comments: Dix-Hallpike maneuver elicited nystagmus when head tilted to the right.  Earwax present in the left ear canal.  Tympanic membranes pearly. Cardiovascular:     Rate and Rhythm: Normal rate and regular rhythm.  Pulmonary:     Effort: No respiratory distress.  Neurological:     Mental Status: She is alert.     Cranial Nerves: No cranial nerve deficit.     Comments: Decresed sensation over the left great toe.     Reviewed CBC and CMP from the hospital which were within normal limits other than elevated glucose.  Assessment & Plan:   Assessment & Plan Benign paroxysmal positional vertigo of right ear Patient presented with approximately 4 days of dizziness associated with changes in head position.  Presented the ED on 8/1, diagnosed with BPPV, discharged with meclizine .  She has been using meclizine  and has noticed some improvement of her symptoms.  The ED recommended that she follow-up with physical therapy but the patient  has not done this because she did not know if she needed to.  She does have associated symptoms of headache and mild left ear pain and tinnitus.  Her lab work in the ED was normal. On physical exam she had nystagmus during right-sided Dix-Hallpike testing.  Her orthostatic vital signs were negative.  The timing of her symptoms seem most likely to be caused by BPPV.  She does not have any focal neurologic deficits to imply some sort of mass effect.  Differential also includes vestibular neuritis, although this would be associated with longer episodes of vertigo.   She was shown a video of an Epley maneuver.  She was also provided with a handout demonstrating how to do the Epley maneuver without assistance.  She was instructed to do this 3 times a day until symptoms resolve.  She may continue to use meclizine  as she finds it somewhat helpful but will likely not need this for very long.  - Self-directed Epley maneuver 3 times a day for as long as her symptoms last. -Referral to PT for vestibular rehab, patient would like to try self-directed maneuvers before committing to physical therapy.  Referral placed in case the problem does not resolve with self treatment. Prediabetes Patient also mentions new left foot numbness with polyuria.  Glucose elevated to 144 and 167 at previous ED visit.  A1c in the prediabetes range at 6.3 in September 2022.  -POC A1C today Healthcare maintenance Last Pap smear 2022.  Positive for high risk HPV.  Will schedule her follow-up appointment in 2 weeks for Pap smear  Orders Placed This Encounter  Procedures   Glucose, capillary   Ambulatory referral to Physical Therapy    Referral Priority:   Routine    Referral Type:   Physical Medicine    Referral Reason:   Specialty Services Required    Requested Specialty:   Physical Therapy    Number of Visits Requested:   1   POC Hbg A1C     Patient seen with Dr. Mliss Lovie [name], MD [facility] Internal Medicine Resident PGY-1 Clinic Phone: [phone] Please contact the on call pager at [phone] for any urgent or emergent needs.

## 2023-11-07 NOTE — Patient Instructions (Addendum)
 Thank you, [name], for allowing us  to provide your care today. Today we discussed . . .  > Dizziness       - Your dizziness is likely due to benign paroxysmal positional vertigo.  We discussed different strategies to combat this in today's visit.  The YouTube link that we discussed is [email or web address].  I have also printed out instructions for the Epley maneuver.  Try to do this 3 times a day until your symptoms resolve.  If your symptoms do not resolve, we will put in a consult to physical therapy for vestibular rehab.  You may continue to take the meclizine  if it is helpful. > Prediabetes       - We discussed the numbness in your toe and your frequent urination.  Since you have had an elevated slightly elevated A1c in the past we will get another A1c today to make sure that you have not developed diabetes > Cervical cancer screening       - You are due for your Pap smear and we will schedule this in 2 weeks from now.   I have ordered the following labs for you:  Lab Orders         POC Hbg A1C       Referrals ordered today:   Referral Orders         Ambulatory referral to Physical Therapy        Follow up: 2 weeks for a pap smear and to discuss your dizziness    Remember:  Should you have any questions or concerns please call the internal medicine clinic at [phone].     Hannah Stone, [facility]

## 2023-11-09 NOTE — Progress Notes (Signed)
 Internal Medicine Clinic Attending  I was physically present during the key portions of the resident provided service and participated in the medical decision making of patient's management care. I reviewed pertinent patient test results.  The assessment, diagnosis, and plan were formulated together and I agree with the documentation in the resident's note.  Hannah Clarity, MD   Patient had a positive Dix-Halpike maneuver with her head turned down and to the right, which fits with her symptoms of BPPV. We showed her a video on the Epley maneuver, gave her that link, and she'll try this at home. We also referred to vestibular PT .

## 2023-11-23 ENCOUNTER — Encounter: Payer: Self-pay | Admitting: Physical Therapy

## 2023-11-23 ENCOUNTER — Ambulatory Visit: Attending: Internal Medicine | Admitting: Physical Therapy

## 2023-11-23 VITALS — BP 111/70 | HR 80

## 2023-11-23 DIAGNOSIS — H8111 Benign paroxysmal vertigo, right ear: Secondary | ICD-10-CM | POA: Diagnosis not present

## 2023-11-23 DIAGNOSIS — R42 Dizziness and giddiness: Secondary | ICD-10-CM | POA: Insufficient documentation

## 2023-11-23 NOTE — Therapy (Signed)
 OUTPATIENT PHYSICAL THERAPY VESTIBULAR EVALUATION     Patient Name: Hannah Stone MRN: [id number] DOB:1990/06/22, 33 y.o., female Today's Date: 11/24/2023  END OF SESSION:  PT End of Session - 11/23/23 1448     Visit Number 1    Number of Visits 5    Date for PT Re-Evaluation 12/24/23    Authorization Type [place] MEDICAID HEALTHY BLUE    PT Start Time 1447    PT Stop Time 1528    PT Time Calculation (min) 41 min    Activity Tolerance Patient tolerated treatment well   limited by dizziness/nausea   Behavior During Therapy [facility] for tasks assessed/performed          Past Medical History:  Diagnosis Date   Asthma    Past Surgical History:  Procedure Laterality Date   NO PAST SURGERIES     Patient Active Problem List   Diagnosis Date Noted   Carpal tunnel syndrome of right wrist 07/16/2021   Moderate episode of recurrent major depressive disorder (HCC) 08/15/2019   Pre-diabetes 08/15/2019   Tobacco use 08/15/2019   Furuncle of cheek 08/15/2019   BMI 31.0-31.9,adult 10/16/2015   History of preterm delivery 10/14/2015    PCP: Karna Fellows, MD REFERRING PROVIDER: Karna Fellows, MD  REFERRING DIAG: H81.11 (ICD-10-CM) - Benign paroxysmal positional vertigo of right ear  THERAPY DIAG:  BPPV (benign paroxysmal positional vertigo), right  Dizziness and giddiness  ONSET DATE: 11/07/2023  Rationale for Evaluation and Treatment: Rehabilitation  SUBJECTIVE:   SUBJECTIVE STATEMENT: Haven't been taking the Meclizine , last time she took it was on Sunday. Still getting dizzy when laying back. Tried the Epley maneuver and thought it helped a little bit, but unsure if she is doing it correctly. Tried it about 3 times. Bending over will also bring it on. Not happening as often. Was on vacation and back to worse so does notice it when she bends over to look at something in the past few days   Pt accompanied by: self  PERTINENT HISTORY:  Per PCP: She was seen in the ED on  8/1 for concerns of dizziness and shortness of breath, diagnosed with benign positional paroxysmal vertigo and prescribed Meclizine . She was advised to make an appointment with a physical therapist at that time.   Her symptoms are associated mostly with changes in position.  She feels like the room is spinning around her.  The episodes last for 2 to 3 minutes.  The worst position is when she bends over.  Her symptoms are associated with movements of her head as well.  She has been taking meclizine  with some improvement of her symptoms.   PAIN:  Are you having pain? No  Vitals:   11/23/23 1457  BP: 111/70  Pulse: 80     PRECAUTIONS: None  FALLS: Has patient fallen in last 6 months? 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  DIAGNOSTIC FINDINGS: No pertinent imaging   COGNITION: Overall cognitive status: Within functional limits for tasks assessed    Cervical ROM:   WNL    TRANSFERS: Assistive device utilized: None  Sit to stand: Complete Independence Stand to sit: Complete Independence   GAIT: Gait pattern: WFL Distance walked: Clinic distances  Assistive device utilized: None Level of assistance: Complete Independence    VESTIBULAR ASSESSMENT:  GENERAL OBSERVATION: Ambulates  in independently with no AD.   SYMPTOM BEHAVIOR:  Subjective history: See above.   Non-Vestibular symptoms: nausea/vomiting and does have nausea   Type of dizziness: Spinning/Vertigo and weird feeling   Frequency: 1x a day, mainly at night   Duration: ~30 seconds, doesn't last long   Aggravating factors: Induced by position change: lying supine and Induced by motion: bending down to the ground and moving too quickly   Relieving factors: taking her time, being conscious of those movements   Progression of  symptoms: better  OCULOMOTOR EXAM:  Ocular Alignment: normal  Ocular ROM: No Limitations  Spontaneous Nystagmus: absent  Gaze-Induced Nystagmus: absent  Smooth Pursuits: intact  Saccades: intact  VESTIBULAR - OCULAR REFLEX:   Slow VOR: Normal  VOR Cancellation: Normal  Head-Impulse Test: HIT Right: negative HIT Left: negative    POSITIONAL TESTING: Right Dix-Hallpike: upbeating, right nystagmus and delayed about 10 seconds and then nystagmus lasting approx 10 seconds  Left Dix-Hallpike: downbeating, left nystagmus and appeared to be downbeating L and rotary, very mild, pt reporting room spinning                                                                                                                               TREATMENT DATE: 11/23/23  Therapeutic Activity: (unable to bill due to Medicaid)  Canalith Repositioning:  Epley Right: Number of Reps: 1, Response to Treatment: symptoms worsened/converted, and Comment: pt felt a little dizziness when sitting up, when going to re-assess and treat again as needed, pt demonstrating brisk geotropic nystagmus that was delayed   *With re-assessment in R DixHallpike, it converted to R horizontal canal, delay of about 15 seconds and then pt with intense geotropic nystagmus with pt immediately needing to sit up  Assessed other canals: R roll: no nystagmus and dizziness L roll: nystagmus and spinning with appearing to be geotropic and rotary? L sidelying: no nystagmus/dizziness R sidelying: intense geotropic nystagmus with pt feeling nauseous and needing to sit up   Provided pt with ginger ale and a cold cloth due to pt feeling nauseous and hot. Pt taking incr time to sit before being able to ambulate out of clinic independently  Provided education throughout regarding BPPV (initially was R posterior) and etiology and tx. Discussed how it converted to horizontal canal and symptoms that go along with conversion and that it will make pt  feel more sick/nauseous. Discussed performing repeated rolling 5 times each side at least 2 times a day to see if that will help resolve as needed. Discussed can take Meclizine  as needed if sx are too much   PATIENT EDUCATION: Education details: See above, POC, clinical findings, BPPV education  Person educated: Patient Education method: Explanation, Demonstration, and Verbal cues Education comprehension: verbalized understanding and verbal cues required  HOME EXERCISE PROGRAM: Repeated rolling   GOALS: Goals reviewed with patient? Yes  SHORT TERM GOALS: ALL STGS = LTGS  LONG TERM GOALS: Target date:  12/22/2023  Pt will demo negative R BPPV in order to decr dizziness.  Baseline: pt with (+) R posterior canalithiasis, but converted to R horizontal canal  Goal status: INITIAL  2.  Pt will report no dizziness with bed mobility or bending over  Baseline: currently dizziness with these movements  Goal status: INITIAL    ASSESSMENT:  CLINICAL IMPRESSION: Patient is a 33 year old female referred to Neuro OPPT for R BPPV.   Pt's PMH is significant for: pre-diabetes. The following deficits were present during the exam: R upbeating rotary nystagmus lasting approx 10 seconds in R DixHallpike indicating R posterior canalithiasis. Treated x1 rep of Epley maneuver with pt tolerating well with minimal dizziness afterwards. When going to re-assess, pt initially with no dizziness or nystagmus for 15 seconds and then pt demonstrated brisk geotropic nystagmus, indicating conversion to R horizontal canal. Pt with incr nausea and feeling hot so provided pt with cold towel and ginger ale. Educated on conversion of canals and discussed having pt perform repeated rolling at home to help clear R horizontal canalithiasis. Pt feeling better at end of session after prolonged seated rest and able to ambulate out independently. Will re-assess all canals at next session. Pt would benefit from skilled PT to address  these impairments and decr dizziness.   OBJECTIVE IMPAIRMENTS: dizziness.   ACTIVITY LIMITATIONS: bending and bed mobility  PARTICIPATION LIMITATIONS: N/A  PERSONAL FACTORS: Past/current experiences, Time since onset of injury/illness/exacerbation, and 1 comorbidity: Pre-diabetes are also affecting patient's functional outcome.   REHAB POTENTIAL: Good  CLINICAL DECISION MAKING: Stable/uncomplicated  EVALUATION COMPLEXITY: Low   PLAN:  PT FREQUENCY: 1-2x/week  PT DURATION: 4 weeks  PLANNED INTERVENTIONS: 97164- PT Re-evaluation, 97110-Therapeutic exercises, 97530- Therapeutic activity, 97112- Neuromuscular re-education, 97535- Self Care, [id number]- Manual therapy, [phone]- Canalith repositioning, Patient/Family education, Balance training, and Vestibular training  PLAN FOR NEXT SESSION: re-assess canals and treat - it was initially R posterior canalithiasis, but converted to R horizontal canal (had a very delayed reaction in R DixHallpike), educated for pt to perform repeated rolling at home    [place] LOISE Senate, PT, DPT 11/24/2023, 8:05 AM   Check all possible CPT codes: [phone] - PT Re-evaluation, [phone]- Neuro Re-education, [phone] - Gait Training, [phone] - Therapeutic Activities, [phone] - Self Care, and [phone] - Canalith Repositioning    Check all conditions that are expected to impact treatment: None of these apply   If treatment provided at initial evaluation, no treatment charged due to lack of authorization.

## 2023-12-06 ENCOUNTER — Ambulatory Visit: Attending: Internal Medicine
